# Patient Record
Sex: Male | Born: 1960 | Race: White | Hispanic: No | Smoking: Current every day smoker
Health system: Southern US, Community
[De-identification: ages and names within clinical notes are randomized; demographics above are authoritative.]

## PROBLEM LIST (undated history)

## (undated) DIAGNOSIS — C679 Malignant neoplasm of bladder, unspecified: Secondary | ICD-10-CM

## (undated) DIAGNOSIS — R319 Hematuria, unspecified: Secondary | ICD-10-CM

## (undated) DIAGNOSIS — F1121 Opioid dependence, in remission: Secondary | ICD-10-CM

## (undated) DIAGNOSIS — Z973 Presence of spectacles and contact lenses: Secondary | ICD-10-CM

## (undated) DIAGNOSIS — F1021 Alcohol dependence, in remission: Secondary | ICD-10-CM

## (undated) DIAGNOSIS — Z87898 Personal history of other specified conditions: Secondary | ICD-10-CM

## (undated) DIAGNOSIS — H9190 Unspecified hearing loss, unspecified ear: Secondary | ICD-10-CM

## (undated) DIAGNOSIS — N343 Urethral syndrome, unspecified: Secondary | ICD-10-CM

## (undated) DIAGNOSIS — F1991 Other psychoactive substance use, unspecified, in remission: Secondary | ICD-10-CM

---

## 2002-11-19 ENCOUNTER — Emergency Department (HOSPITAL_COMMUNITY): Admission: EM | Admit: 2002-11-19 | Discharge: 2002-11-19 | Payer: Self-pay | Admitting: Emergency Medicine

## 2013-09-24 ENCOUNTER — Ambulatory Visit: Payer: BC Managed Care – PPO

## 2013-09-24 ENCOUNTER — Ambulatory Visit (INDEPENDENT_AMBULATORY_CARE_PROVIDER_SITE_OTHER): Payer: BC Managed Care – PPO | Admitting: Family Medicine

## 2013-09-24 VITALS — BP 110/70 | HR 87 | Temp 98.6°F | Resp 18 | Ht 67.5 in | Wt 160.2 lb

## 2013-09-24 DIAGNOSIS — M25562 Pain in left knee: Secondary | ICD-10-CM

## 2013-09-24 DIAGNOSIS — M25569 Pain in unspecified knee: Secondary | ICD-10-CM

## 2013-09-24 DIAGNOSIS — IMO0002 Reserved for concepts with insufficient information to code with codable children: Secondary | ICD-10-CM

## 2013-09-24 DIAGNOSIS — S40212A Abrasion of left shoulder, initial encounter: Secondary | ICD-10-CM

## 2013-09-24 MED ORDER — DICLOFENAC SODIUM 75 MG PO TBEC: 75.0000 mg | DELAYED_RELEASE_TABLET | Freq: Two times a day (BID) | ORAL | Status: DC

## 2013-09-24 MED ORDER — HYDROCODONE-ACETAMINOPHEN 5-325 MG PO TABS: 1.0000 | ORAL_TABLET | Freq: Four times a day (QID) | ORAL | Status: DC | PRN

## 2013-09-24 NOTE — Addendum Note (Signed)
Addended by: Ivor Reining on: 09/24/2013 12:12 PM   Modules accepted: Level of Service
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
## 2013-09-24 NOTE — Patient Instructions (Signed)
Please return if knee pain persists or worsens over the next 3 or 4 days. The x-rays are normal and it appears that you had just got a bad bone bruise. He can use the knee immobilizer for the next 5-7 days.

## 2014-02-22 ENCOUNTER — Emergency Department (HOSPITAL_COMMUNITY)
Admission: EM | Admit: 2014-02-22 | Discharge: 2014-02-22 | Disposition: A | Discharge status: Home / Self Care | Payer: 59 | Attending: Emergency Medicine | Admitting: Emergency Medicine

## 2014-02-22 ENCOUNTER — Encounter (HOSPITAL_COMMUNITY): Payer: Self-pay

## 2014-02-22 DIAGNOSIS — T7840XA Allergy, unspecified, initial encounter: Secondary | ICD-10-CM

## 2014-02-22 DIAGNOSIS — Z791 Long term (current) use of non-steroidal anti-inflammatories (NSAID): Secondary | ICD-10-CM | POA: Diagnosis not present

## 2014-02-22 DIAGNOSIS — X58XXXA Exposure to other specified factors, initial encounter: Secondary | ICD-10-CM | POA: Insufficient documentation

## 2014-02-22 DIAGNOSIS — L500 Allergic urticaria: Secondary | ICD-10-CM | POA: Diagnosis not present

## 2014-02-22 DIAGNOSIS — Y9389 Activity, other specified: Secondary | ICD-10-CM | POA: Diagnosis not present

## 2014-02-22 DIAGNOSIS — Z720 Tobacco use: Secondary | ICD-10-CM | POA: Diagnosis not present

## 2014-02-22 DIAGNOSIS — Y998 Other external cause status: Secondary | ICD-10-CM | POA: Diagnosis not present

## 2014-02-22 DIAGNOSIS — Y9289 Other specified places as the place of occurrence of the external cause: Secondary | ICD-10-CM | POA: Insufficient documentation

## 2014-02-22 MED ORDER — TRAMADOL HCL 50 MG PO TABS: 50.0000 mg | ORAL_TABLET | Freq: Four times a day (QID) | ORAL | Status: DC | PRN

## 2014-02-22 NOTE — Discharge Instructions (Signed)
Allergies  Allergies may happen from anything your body is sensitive to. This may be food, medicines, pollens, chemicals, and many other things. Food allergies can be severe and deadly.  HOME CARE  If you do not know what causes a reaction, keep a diary. Write down the foods you ate and the symptoms that followed. Avoid foods that cause reactions.  If you have red raised spots (hives) or a rash:  Take medicine as told by your doctor.  Use medicines for red raised spots and itching as needed.  Apply cold cloths (compresses) to the skin. Take a cool bath. Avoid hot baths or showers.  If you are severely allergic:  It is often necessary to go to the hospital after you have treated your reaction.  Wear your medical alert jewelry.  You and your family must learn how to give a allergy shot or use an allergy kit (anaphylaxis kit).  Always carry your allergy kit or shot with you. Use this medicine as told by your doctor if a severe reaction is occurring. GET HELP RIGHT AWAY IF:  You have trouble breathing or are making high-pitched whistling sounds (wheezing).  You have a tight feeling in your chest or throat.  You have a puffy (swollen) mouth.  You have red raised spots, puffiness (swelling), or itching all over your body.  You have had a severe reaction that was helped by your allergy kit or shot. The reaction can return once the medicine has worn off.  You think you are having a food allergy. Symptoms most often happen within 30 minutes of eating a food.  Your symptoms have not gone away within 2 days or are getting worse.  You have new symptoms.  You want to retest yourself with a food or drink you think causes an allergic reaction. Only do this under the care of a doctor. MAKE SURE YOU:   Understand these instructions.  Will watch your condition.  Will get help right away if you are not doing well or get worse. Document Released: 06/21/2012 Document Reviewed:  06/21/2012 [facility] Patient Information 2015 [place]. This information is not intended to replace advice given to you by your health care provider. Make sure you discuss any questions you have with your health care provider.   Emergency Department Resource Guide 1) Find a Doctor and Pay Out of Pocket Although you won't have to find out who is covered by your insurance plan, it is a good idea to ask around and get recommendations. You will then need to call the office and see if the doctor you have chosen will accept you as a new patient and what types of options they offer for patients who are self-pay. Some doctors offer discounts or will set up payment plans for their patients who do not have insurance, but you will need to ask so you aren't surprised when you get to your appointment.  2) Contact Your Local Health Department Not all health departments have doctors that can see patients for sick visits, but many do, so it is worth a call to see if yours does. If you don't know where your local health department is, you can check in your phone book. The CDC also has a tool to help you locate your state's health department, and many state websites also have listings of all of their local health departments.  3) Find a [facility] If your illness is not likely to be very severe or complicated, you may want  to try a walk in clinic. These are popping up all over the country in pharmacies, drugstores, and shopping centers. They're usually staffed by nurse practitioners or physician assistants that have been trained to treat common illnesses and complaints. They're usually fairly quick and inexpensive. However, if you have serious medical issues or chronic medical problems, these are probably not your best option.  No Primary Care Doctor: - Call Health Connect at  [phone] - they can help you locate a primary care doctor that  accepts your insurance, provides certain services, etc. - Physician  Referral Service- [phone]  Chronic Pain Problems: Organization         Address  Phone   Notes  [facility]  [phone] Patients need to be referred by their primary care doctor.   Medication Assistance: Organization         Address  Phone   Notes  [facility] Medication [facility] [address] [place], [place] [address] [phone] --Must be a resident of [facility] -- Must have NO insurance coverage whatsoever (no Medicaid/ Medicare, etc.) -- The pt. MUST have a primary care doctor that directs their care regularly and follows them in the community   [facility]ist  [phone]   [employer]  [phone]    Agencies that provide inexpensive medical care: Organization         Address  Phone   Notes  [place]  [phone]   [facility]    [phone]   [facility] [place], [place] [address] [phone]   [place] [address], [place] [phone]   Planned Parenthood    [phone]   [facility]    [phone]   [place] and [place] [address], [place] Phone:  [phone], Fax:  [phone] Hours of Operation:  9 am - 6 pm, M-F.  Also accepts Medicaid/Medicare and self-pay.  [facility] for [place], [address], [place] Phone: [phone], Fax: [phone]. Hours of Operation:  8:30 am - 5:30 pm, M-F.  Also accepts Medicaid and self-pay.  [facility] [place] [address] [place] Phone: [phone]   [place], [place], [place] [phone], Ext. [phone] Mondays & Thursdays: 7-9 AM.  First 15 patients are seen on a first come, first serve basis.    [place] Providers:  Organization         Address  Phone   Notes  [facility] [address], [place] [phone] Also accepts self-pay patients.  [facility] [address], [place]  [phone]   [place], [address], [place] [phone]   [facility] [address], [place] [phone]   Lucianne Lei [address], [place]   [phone] Only accepts [place] Access [place] patients after they have their name applied to their card.   Self-Pay (no insurance) in [facility]:  Organization         Address  Phone   Notes  Sickle Cell Patients, [facility] [place] [phone]   [facility] [place] [phone]  [facility] [place]  [address], [address], [place] [phone]   [facility]/Dr. Osei-Bonsu  [address], [place] or [address], [place] [phone] Phone number for both [place] and [place] locations is the same.  [facility] [address], [place] [phone]   [facility] [address] [place] or [address] [phone]   [facility] [address] [place] [phone], phone; [phone], fax Sees patients 1st and 3rd Saturday of every month.  Must not qualify for public or private insurance (i.e. Medicaid, Medicare, [place] Health Choice, Veterans' Benefits)  Household income should be no more than 200% of the poverty level The clinic cannot treat you if you are pregnant or think you are pregnant  Sexually transmitted diseases are not treated at the clinic.    Dental Care: Organization         Address  Phone  Notes  [facility] of [facility] [place] [phone] Accepts children up to age 14 who are enrolled  in [place] or [place]; pregnant women with a Medicaid card; and children who have applied for Medicaid or [place] Health Choice, but were declined, whose parents can pay a reduced fee at time of service.  [facility] Department of [facility]  [address], [place] [phone] Accepts children up to age 99 who are enrolled in [place] or [place]; pregnant women with a Medicaid card; and children who have applied for Medicaid or [place] Health Choice, but were declined, whose parents can pay a reduced fee at time of service.  [place] Adult [facility] Access PRO[facility]  [facility] [phone] Patients are seen by appointment only. Walk-ins are not accepted. [place] will see patients 47 years of age and older. Monday - Tuesday (8am-5pm) Most Wednesdays (8:30-5pm) $30 per visit, cash only  [facility] Adult Dental Access PROGRAM  [address], [facility] [phone] Patients are seen by appointment only. Walk-ins are not accepted. [place] will see patients 18 years of age and older. One Wednesday Evening (Monthly: Volunteer Based).  $30 per visit, cash only  [place]  [phone] for adults; Children under age 56, call Graduate Pediatric Dentistry at [phone]. Children aged 14-14, please call [phone] to request a pediatric application.  Dental services are provided in all areas of dental care including fillings, crowns and bridges, complete and partial dentures, implants, gum treatment, root canals, and extractions. Preventive care is also provided. Treatment is provided to both adults and children. Patients are selected via a lottery and there is often a waiting list.   [facility] [address] [place]  [phone] [email or web address]   [facility] [address] [place], [place] [phone], Ext. [phone] Second and Fourth Thursday of each month, opens at  6:30 AM; Clinic ends at 9 AM.  Patients are seen on a first-come first-served basis, and a limited number are seen during each clinic.   [facility]  [address] [place], [place] [phone]   Eligibility Requirements You must have lived in [place], [place], or [place] counties for at least the last three months.   You cannot be eligible for state or federal sponsored  healthcare insurance, including [employer], [place], or [employer].   You generally cannot be eligible for healthcare insurance through your employer.    How to apply: Eligibility screenings are held every Tuesday and Wednesday afternoon from 1:00 pm until 4:00 pm. You do not need an appointment for the interview!  [facility] [address] [place], [place]   [place]  [place] Department  [place]  [phone]    Behavioral Health Resources in the Community: Intensive Outpatient Programs Organization         Address  Phone  Notes  [place] [address], [place], [place] [phone]   [facility]patient [address], [place], [place]   ADS: [facility] [address] [place], [place]   [place] [address]  [place], [place] or [phone]   Substance Abuse Resources Organization         Address  Phone  Notes  Alcohol and Drug Services  [phone]   [place]  [phone]   [facility]   [place]  [phone]   Residential & Outpatient Substance Abuse Program  [phone]   Psychological Services Organization         Address  Phone  Notes  [facility] [place]  [place]  [phone]   [place] [address], [place] or [phone]

## 2014-02-22 NOTE — ED Notes (Signed)
Per EMS pt had Dental procedure today at 4:30pm; Pt believes he had allergic reaction to Novocain used; Pt took Tylenol and Advil at 1800 and 1830 pt noticed hives on chest, back ,neck. Pt had Sob on arrival; pt is current smoker; pt denies pain on arrival; pt received 50 mg Benadryl, 0.3 EPI IM and 50 mg Zantac IV prior to arrival

## 2014-02-25 NOTE — ED Provider Notes (Signed)
CSN: [id number]     Arrival date & time 02/22/14  1941 History   First MD Initiated Contact with Patient 02/22/14 1942     Chief Complaint  Patient presents with  . Allergic Reaction      HPI Per EMS pt had Dental procedure today at 4:30pm; Pt believes he had allergic reaction to Novocain used; Pt took Tylenol and Advil at 1800 and 1830 pt noticed hives on chest, back ,neck. Pt had Sob on arrival; pt is current smoker; pt denies pain on arrival; pt received 50 mg Benadryl, 0.3 EPI IM and 50 mg Zantac IV prior to arrival  History reviewed. No pertinent past medical history. History reviewed. No pertinent past surgical history. Family History  Problem Relation Age of Onset  . Diabetes Mother   . Stroke Mother   . Diabetes Father   . Heart disease Father   . Heart disease Brother   . Heart disease Paternal Grandfather    History  Substance Use Topics  . Smoking status: Current Some Day Smoker  . Smokeless tobacco: Not on file  . Alcohol Use: Yes     Comment: pt state he has "been clean for 60 days"     Review of Systems  All other systems reviewed and are negative  Allergies  Advil  Home Medications   Prior to Admission medications   Medication Sig Start Date End Date Taking? Authorizing Provider  acetaminophen (TYLENOL) 500 MG tablet Take 1,000 mg by mouth every 6 (six) hours as needed for moderate pain.   Yes Historical Provider, MD  ibuprofen (ADVIL,MOTRIN) 200 MG tablet Take 400 mg by mouth every 6 (six) hours as needed for moderate pain.   Yes Historical Provider, MD  diclofenac (VOLTAREN) 75 MG EC tablet Take 1 tablet (75 mg total) by mouth 2 (two) times daily. 09/24/13   Robyn Haber, MD  HYDROcodone-acetaminophen (NORCO) 5-325 MG per tablet Take 1 tablet by mouth every 6 (six) hours as needed for moderate pain. 09/24/13   Robyn Haber, MD  traMADol (ULTRAM) 50 MG tablet Take 1 tablet (50 mg total) by mouth every 6 (six) hours as needed. 02/22/14   Dot Lanes, MD   BP 90/52 mmHg  Pulse 62  Temp(Src) 97.6 F (36.4 C) (Oral)  Resp 16  SpO2 99% Physical Exam Physical Exam  Nursing note and vitals reviewed. Constitutional: He is oriented to person, place, and time. He appears well-developed and well-nourished. No distress.  HENT:  Head: Normocephalic and atraumatic.  Eyes: Pupils are equal, round, and reactive to light.  Neck: Normal range of motion.  Cardiovascular: Normal rate and intact distal pulses.   Pulmonary/Chest: No respiratory distress.  Abdominal: Normal appearance. He exhibits no distension.  Musculoskeletal: Normal range of motion.  Neurological: He is alert and oriented to person, place, and time. No cranial nerve deficit.  Skin: Skin is warm and dry. No rash noted.  Psychiatric: He has a normal mood and affect. His behavior is normal.   ED Course  Procedures (including critical care time)  After treatment in the ED the patient feels back to baseline and wants to go home. Labs Review Labs Reviewed - No data to display    EKG Interpretation   Date/Time:  Wednesday February 22 2014 19:52:00 EST Ventricular Rate:  57 PR Interval:    QRS Duration: 89 QT Interval:  428 QTC Calculation: 417 R Axis:   80 Text Interpretation:  Atrial fibrillation Ventricular premature complex ED  PHYSICIAN  INTERPRETATION AVAILABLE IN [place] Confirmed by TEST,  Record ([id number]) on 02/24/2014 9:28:51 AM      MDM   Final diagnoses:  Allergic reaction, initial encounter        Dot Lanes, MD 02/25/14 [id number]

## 2014-10-19 ENCOUNTER — Other Ambulatory Visit: Payer: Self-pay | Admitting: Urology

## 2014-10-24 ENCOUNTER — Encounter (HOSPITAL_BASED_OUTPATIENT_CLINIC_OR_DEPARTMENT_OTHER): Payer: Self-pay | Admitting: *Deleted

## 2014-10-24 NOTE — Progress Notes (Signed)
Pt instructed npo pmn 8/22.  To [facility] 8/23 @ 0800.  Needs hgb on arrival.  Pt instructed no smoking p mn as well.

## 2014-10-30 NOTE — H&P (Signed)
History of Present Illness F/u -     1 - bladder neoplasm - Jul 2016 - gross hematuria x 6 mo. No flank pain or constitutional symptoms. 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 He is a recovering heroin and meth addict. He's been sober for about 9 months. He needs to avoid any narcotic prescriptions.      Aug 2016 interval hx  Patient returns and continued management of gross hematuria. CT hematuria protocol revealed a bladder tumor but without evidence of T3 disease, lymphadenopathy or bony metastases. He has been well without any changes to his history.       Past Medical History Problems  1. History of depression (Z86.59)  Surgical History Problems  1. History of No Surgical Problems  Current Meds 1. No Reported Medications Recorded  Allergies Medication  1. No Known Drug Allergies  Family History Problems  1. Family history of pneumonia (Z83.1) : Mother  Social History Problems  1. Current every day smoker (F17.200) 2. Daily caffeine consumption, 2-3 servings a day 3. Divorced 4. Mother deceased 62. No alcohol use  Review of Systems  Cardiovascular: chest pain.  Respiratory: shortness of breath.    Vitals Vital Signs [Data Includes: Last 1 Day]  Recorded: 10Aug2016 08:08AM  Blood Pressure: 120 / 77 Temperature: 97 F Heart Rate: 57  Physical Exam Constitutional: Well nourished and well developed . No acute distress.  Pulmonary: No respiratory distress and normal respiratory rhythm and effort.  Cardiovascular: Heart rate and rhythm are normal . No peripheral edema.  Neuro/Psych:. Mood and affect are appropriate.    Results/Data Urine [Data Includes: Last 1 Day]   [id number]  COLOR AMBER   APPEARANCE CLOUDY   SPECIFIC GRAVITY 1.025   pH 6.5   GLUCOSE NEGATIVE   BILIRUBIN NEGATIVE   KETONE NEGATIVE   BLOOD 3+   PROTEIN 2+   NITRITE NEGATIVE   LEUKOCYTE ESTERASE 1+   SQUAMOUS EPITHELIAL/HPF 0-5 HPF  WBC 6-10 WBC/HPF  RBC >60  RBC/HPF  BACTERIA FEW HPF  CRYSTALS NONE SEEN HPF  CASTS NONE SEEN LPF  Yeast NONE SEEN HPF   the following images/tracing/specimen were independently visualized: 1  CT1 .     1 Amended By: Festus Aloe; Oct 18 2014 8:48 AM EST  Procedure  Procedure:1  Cystoscopy1    Indication:1  Hematuria1 . History of Urothelial Carcinoma1 .  Informed Consent: 1  Risks, benefits, and potential adverse events were discussed and informed consent was obtained1  from the patient1 .  Prep:1  The patient was prepped with betadine1 .  Antibiotic prophylaxis:1  Ciprofloxacin1 .  Procedure Note:  Urethral meatus:1 . No abnormalities1 .  Anterior urethra:1  No abnormalities1 .  Prostatic urethra:1  No abnormalities1 .  Bladder: Visulization was1  clear1 . The ureteral orifices1  were in the normal anatomic position bilaterally1  and had clear efflux of urine1 . A  sessile1  tumor was seen in the bladder1 . This tumor was located  on the anterior aspect1  ,  at the neck1  of the bladder1 .  and extending out anteriorly for about 5 cm.1 The patient tolerated the procedure well1   Complications:1  None1 .     1 Amended By: Festus Aloe; Oct 18 2014 8:47 AM EST  Assessment Assessed  1. 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 Follow-up Schedule Surgery Office  Follow-up  Status: Hold For - Appointment   Requested for: 10Aug2016 Health Maintenance  2. UA With REFLEX; [Do Not Release]; Status:Resulted - Requires Verification;   Done:  [id number] 07:53AM  Discussion/Summary       bladder neoplasm -- patient has a nodular anterior bladder tumor that extends down into the bladder neck. I discussed with him the CT and cystoscopic findings and the nature risks benefits and alternatives to TURBT. The tumor is broad-based and nodular and he is not a good candidate for mitomycin. We discussed he may need a staged procedure with a repeat resection and need for foley, etc. All questions  answered. He elects to proceed.       Signatures Electronically signed by : Festus Aloe, M.D.; Oct 18 2014  8:48AM EST

## 2014-10-31 ENCOUNTER — Ambulatory Visit (HOSPITAL_BASED_OUTPATIENT_CLINIC_OR_DEPARTMENT_OTHER): Payer: Commercial Managed Care - HMO | Admitting: Anesthesiology

## 2014-10-31 ENCOUNTER — Ambulatory Visit (HOSPITAL_BASED_OUTPATIENT_CLINIC_OR_DEPARTMENT_OTHER)
Admission: RE | Admit: 2014-10-31 | Discharge: 2014-10-31 | Disposition: A | Discharge status: Home / Self Care | Payer: Commercial Managed Care - HMO | Source: Ambulatory Visit | Attending: Urology | Admitting: Urology

## 2014-10-31 ENCOUNTER — Encounter (HOSPITAL_BASED_OUTPATIENT_CLINIC_OR_DEPARTMENT_OTHER): Payer: Self-pay | Admitting: *Deleted

## 2014-10-31 ENCOUNTER — Encounter (HOSPITAL_BASED_OUTPATIENT_CLINIC_OR_DEPARTMENT_OTHER)
Admission: RE | Disposition: A | Discharge status: Home / Self Care | Payer: Self-pay | Source: Ambulatory Visit | Attending: Urology

## 2014-10-31 DIAGNOSIS — F17210 Nicotine dependence, cigarettes, uncomplicated: Secondary | ICD-10-CM | POA: Insufficient documentation

## 2014-10-31 DIAGNOSIS — C674 Malignant neoplasm of posterior wall of bladder: Secondary | ICD-10-CM | POA: Diagnosis not present

## 2014-10-31 DIAGNOSIS — C673 Malignant neoplasm of anterior wall of bladder: Secondary | ICD-10-CM | POA: Insufficient documentation

## 2014-10-31 DIAGNOSIS — D494 Neoplasm of unspecified behavior of bladder: Secondary | ICD-10-CM | POA: Diagnosis present

## 2014-10-31 DIAGNOSIS — F329 Major depressive disorder, single episode, unspecified: Secondary | ICD-10-CM | POA: Diagnosis not present

## 2014-10-31 DIAGNOSIS — R310 Gross hematuria: Secondary | ICD-10-CM | POA: Diagnosis not present

## 2014-10-31 HISTORY — DX: Hematuria, unspecified: R31.9

## 2014-10-31 HISTORY — PX: TRANSURETHRAL RESECTION OF BLADDER TUMOR: SHX2575

## 2014-10-31 HISTORY — DX: Presence of spectacles and contact lenses: Z97.3

## 2014-10-31 HISTORY — DX: Unspecified hearing loss, unspecified ear: H91.90

## 2014-10-31 HISTORY — DX: Urethral syndrome, unspecified: N34.3

## 2014-10-31 HISTORY — DX: Opioid dependence, in remission: F11.21

## 2014-10-31 HISTORY — PX: CYSTOSCOPY WITH URETHRAL DILATATION: SHX5125

## 2014-10-31 LAB — POCT HEMOGLOBIN-HEMACUE: HEMOGLOBIN: 16 g/dL (ref 13.0–17.0)

## 2014-10-31 SURGERY — TURBT (TRANSURETHRAL RESECTION OF BLADDER TUMOR)
Anesthesia: General | Site: Urethra

## 2014-10-31 MED ORDER — CEFAZOLIN SODIUM-DEXTROSE 2-3 GM-% IV SOLR
INTRAVENOUS | Status: AC
  Filled 2014-10-31: qty 50

## 2014-10-31 MED ORDER — LIDOCAINE HCL (CARDIAC) 20 MG/ML IV SOLN
INTRAVENOUS | Status: DC | PRN
  Administered 2014-10-31: 80 mg via INTRAVENOUS

## 2014-10-31 MED ORDER — SODIUM CHLORIDE 0.9 % IR SOLN
Status: DC | PRN
  Administered 2014-10-31: 3000 mL

## 2014-10-31 MED ORDER — FENTANYL CITRATE (PF) 100 MCG/2ML IJ SOLN
INTRAMUSCULAR | Status: AC
  Filled 2014-10-31: qty 6

## 2014-10-31 MED ORDER — OXYBUTYNIN CHLORIDE 5 MG PO TABS
5.0000 mg | ORAL_TABLET | Freq: Three times a day (TID) | ORAL | Status: DC
  Administered 2014-10-31: 5 mg via ORAL
  Filled 2014-10-31: qty 1

## 2014-10-31 MED ORDER — ONDANSETRON HCL 4 MG/2ML IJ SOLN
INTRAMUSCULAR | Status: DC | PRN
  Administered 2014-10-31: 4 mg via INTRAVENOUS

## 2014-10-31 MED ORDER — FENTANYL CITRATE (PF) 100 MCG/2ML IJ SOLN
25.0000 ug | INTRAMUSCULAR | Status: DC | PRN
  Filled 2014-10-31: qty 1

## 2014-10-31 MED ORDER — CEFAZOLIN SODIUM 1-5 GM-% IV SOLN
1.0000 g | INTRAVENOUS | Status: AC
  Administered 2014-10-31: 2 g via INTRAVENOUS
  Filled 2014-10-31: qty 50

## 2014-10-31 MED ORDER — MIDAZOLAM HCL 2 MG/2ML IJ SOLN
INTRAMUSCULAR | Status: AC
  Filled 2014-10-31: qty 4

## 2014-10-31 MED ORDER — OXYBUTYNIN CHLORIDE 5 MG PO TABS: 5.0000 mg | ORAL_TABLET | Freq: Three times a day (TID) | ORAL | Status: DC | PRN

## 2014-10-31 MED ORDER — LACTATED RINGERS IV SOLN
INTRAVENOUS | Status: DC
  Administered 2014-10-31 (×2): via INTRAVENOUS
  Filled 2014-10-31: qty 1000

## 2014-10-31 MED ORDER — PHENAZOPYRIDINE HCL 200 MG PO TABS
200.0000 mg | ORAL_TABLET | Freq: Three times a day (TID) | ORAL | Status: DC
  Administered 2014-10-31: 200 mg via ORAL
  Filled 2014-10-31: qty 1

## 2014-10-31 MED ORDER — PHENAZOPYRIDINE HCL 200 MG PO TABS: 200.0000 mg | ORAL_TABLET | Freq: Three times a day (TID) | ORAL | Status: DC | PRN

## 2014-10-31 MED ORDER — OXYBUTYNIN CHLORIDE 5 MG PO TABS
ORAL_TABLET | ORAL | Status: AC
  Filled 2014-10-31: qty 1

## 2014-10-31 MED ORDER — MIDAZOLAM HCL 5 MG/5ML IJ SOLN
INTRAMUSCULAR | Status: DC | PRN
  Administered 2014-10-31: 2 mg via INTRAVENOUS

## 2014-10-31 MED ORDER — PHENAZOPYRIDINE HCL 100 MG PO TABS
ORAL_TABLET | ORAL | Status: AC
  Filled 2014-10-31: qty 2

## 2014-10-31 MED ORDER — CEPHALEXIN 500 MG PO CAPS: 500.0000 mg | ORAL_CAPSULE | Freq: Every day | ORAL | Status: DC

## 2014-10-31 MED ORDER — LACTATED RINGERS IV SOLN
INTRAVENOUS | Status: DC
  Filled 2014-10-31: qty 1000

## 2014-10-31 MED ORDER — DEXAMETHASONE SODIUM PHOSPHATE 4 MG/ML IJ SOLN
INTRAMUSCULAR | Status: DC | PRN
  Administered 2014-10-31: 10 mg via INTRAVENOUS

## 2014-10-31 MED ORDER — FENTANYL CITRATE (PF) 100 MCG/2ML IJ SOLN
INTRAMUSCULAR | Status: DC | PRN
  Administered 2014-10-31 (×2): 50 ug via INTRAVENOUS

## 2014-10-31 MED ORDER — PROPOFOL 10 MG/ML IV BOLUS
INTRAVENOUS | Status: DC | PRN
  Administered 2014-10-31: 40 mg via INTRAVENOUS
  Administered 2014-10-31: 180 mg via INTRAVENOUS
  Administered 2014-10-31: 40 mg via INTRAVENOUS

## 2014-10-31 SURGICAL SUPPLY — 30 items
BAG DRAIN URO-CYSTO SKYTR STRL (DRAIN) ×4 IMPLANT
BAG URINE DRAINAGE (UROLOGICAL SUPPLIES) ×4 IMPLANT
BAG URINE LEG 19OZ MD ST LTX (BAG) IMPLANT
CANISTER SUCT LVC 12 LTR MEDI- (MISCELLANEOUS) IMPLANT
CATH FOLEY 2WAY SLVR  5CC 18FR (CATHETERS) ×2
CATH FOLEY 2WAY SLVR  5CC 20FR (CATHETERS)
CATH FOLEY 2WAY SLVR  5CC 22FR (CATHETERS)
CATH FOLEY 2WAY SLVR 5CC 18FR (CATHETERS) ×2 IMPLANT
CATH FOLEY 2WAY SLVR 5CC 20FR (CATHETERS) IMPLANT
CATH FOLEY 2WAY SLVR 5CC 22FR (CATHETERS) IMPLANT
CLOTH BEACON ORANGE TIMEOUT ST (SAFETY) ×4 IMPLANT
DRSG TELFA 3X8 NADH (GAUZE/BANDAGES/DRESSINGS) IMPLANT
ELECT BUTTON BIOP 24F 90D PLAS (MISCELLANEOUS) IMPLANT
ELECT REM PT RETURN 9FT ADLT (ELECTROSURGICAL)
ELECTRODE REM PT RTRN 9FT ADLT (ELECTROSURGICAL) IMPLANT
EVACUATOR MICROVAS BLADDER (UROLOGICAL SUPPLIES) IMPLANT
GLOVE BIO SURGEON STRL SZ7.5 (GLOVE) ×4 IMPLANT
GLOVE INDICATOR 7.5 STRL GRN (GLOVE) ×4 IMPLANT
GLOVE SURG SS PI 7.5 STRL IVOR (GLOVE) ×4 IMPLANT
GOWN STRL REUS W/ TWL XL LVL3 (GOWN DISPOSABLE) ×4 IMPLANT
GOWN STRL REUS W/TWL XL LVL3 (GOWN DISPOSABLE) ×4
HOLDER FOLEY CATH W/STRAP (MISCELLANEOUS) ×4 IMPLANT
IV NS IRRIG 3000ML ARTHROMATIC (IV SOLUTION) ×16 IMPLANT
LOOP CUT BIPOLAR 24F LRG (ELECTROSURGICAL) ×4 IMPLANT
MANIFOLD NEPTUNE II (INSTRUMENTS) ×4 IMPLANT
NS IRRIG 500ML POUR BTL (IV SOLUTION) ×4 IMPLANT
PACK CYSTO (CUSTOM PROCEDURE TRAY) ×4 IMPLANT
PLUG CATH AND CAP STER (CATHETERS) IMPLANT
SET ASPIRATION TUBING (TUBING) IMPLANT
WATER STERILE IRR 500ML POUR (IV SOLUTION) ×4 IMPLANT

## 2014-10-31 NOTE — Interval H&P Note (Signed)
History and Physical Interval Note:  10/31/2014 9:33 AM  Dustin Esparza  has presented today for surgery, with the diagnosis of BLADDDER NEOPLASM  The various methods of treatment have been discussed with the patient and family. After consideration of risks, benefits and other options for treatment, the patient has consented to  Procedure(s): TRANSURETHRAL RESECTION OF BLADDER TUMOR (TURBT) (N/A) as a surgical intervention .  The patient's history has been reviewed, patient examined, no change in status, stable for surgery.  I have reviewed the patient's chart and labs. He has been well with no fever. He continues to have gross hematuria and will have dysuria when he passes blood, but otherwise no bladder pain. I discussed with the patient the nature, potential benefits, risks and alternatives to TURBT, including side effects of the proposed treatment, the likelihood of the patient achieving the goals of the procedure, and any potential problems that might occur during the procedure or recuperation. Discussed with patient, friend and his dad. Discussed risk of perforation, need for foley, need for restaging, etc. All questions answered. Patient elects to proceed.     Dustin Esparza

## 2014-10-31 NOTE — Anesthesia Preprocedure Evaluation (Addendum)
Anesthesia Evaluation  Patient identified by MRN, date of birth, ID band Patient awake    Reviewed: Allergy & Precautions, H&P , NPO status , Patient's Chart, lab work & pertinent test results  Airway Mallampati: II  TM Distance: >3 FB Neck ROM: full    Dental  (+) Dental Advisory Given, Poor Dentition, Chipped, Missing Front upper tooth broken in half:   Pulmonary Current Smoker,  breath sounds clear to auscultation  Pulmonary exam normal       Cardiovascular Exercise Tolerance: Good negative cardio ROS Normal cardiovascular examRhythm:regular Rate:Normal     Neuro/Psych Seizures -, Well Controlled,  Drug related seizure and hives.  He thinks either lidocaine, tylenol, or advil ( at dentist office ) negative psych ROS   GI/Hepatic negative GI ROS, (+)     substance abuse  , History narcotic addiction   Endo/Other  negative endocrine ROS  Renal/GU negative Renal ROS  negative genitourinary   Musculoskeletal   Abdominal   Peds  Hematology negative hematology ROS (+)   Anesthesia Other Findings   Reproductive/Obstetrics negative OB ROS                          Anesthesia Physical Anesthesia Plan  ASA: II  Anesthesia Plan: General   Post-op Pain Management:    Induction: Intravenous  Airway Management Planned: LMA  Additional Equipment:   Intra-op Plan:   Post-operative Plan:   Informed Consent: I have reviewed the patients History and Physical, chart, labs and discussed the procedure including the risks, benefits and alternatives for the proposed anesthesia with the patient or authorized representative who has indicated his/her understanding and acceptance.   Dental Advisory Given  Plan Discussed with: CRNA, Surgeon and Anesthesiologist  Anesthesia Plan Comments:        Anesthesia Quick Evaluation

## 2014-10-31 NOTE — Op Note (Signed)
Preoperative diagnosis: Bladder neoplasm Postoperative diagnosis: Bladder neoplasm  Procedure: TURBT greater than 5 cm  Surgeon: Junious Silk  Anesthesia: Gen.  Indication for procedure: 54 year old white male with gross hematuria for several months. CT and cystoscopy revealed an anterior bladder tumor. He was brought for TURBT.  Findings: On exam under anesthesia the penis was circumcised and without mass or lesion. The testicles were descended bilaterally and palpably normal. On digital rectal exam the prostate was palpably normal with all landmarks preserved. On bimanual exam no bladder mass was palpated and there was no fixation of the tissues.  On cystoscopy the urethra was normal apart from some narrowing at the fossa navicularis. The prostatic urethra was normal. The trigone and ureteral orifices were in their normal orthotopic position. There was bilateral clear efflux. There were no stones or foreign bodies in the bladder. There were 3 areas of tumor in the bladder. There were 2 superficial papillary patches of tumor about a centimeter each lateral and posterior to the left UO respectively. Anteriorly there was a nodular tumor that appeared high-grade and likely muscle invasive. There was a circumferential area bullous edema and inflammation around the tumor. Some of this inflammation extended toward the bladder neck.  Description of procedure: After consent was obtained patient brought to the operating room. After adequate anesthesia the patient was placed in lithotomy position and prepped and draped in the usual sterile fashion. A timeout was performed to confirm the patient and procedure. An exam under anesthesia was performed.  Cystoscope was passed per urethra and the bladder carefully inspected with the 30 and 70 lens. The resectoscope, 25 [place], would not pass due to a tight fossa navicularis which required dilation to 30 [place]. The resectoscope was then passed without difficulty  into the bladder. I resected the the first tumor lateral to the UO and fulgurated it space. The more posterior tumor was up the wall of the bladder and appeared very superficial therefore I got samples of this with the cold cup. Then this area was fulgurated. The 2 left posterior tumors were very similar in appearance depth in morphology and resected together his left posterior tumor.  Next attention was turned anteriorly. The tumor was not even visible with the 30 scope and I doubt constant pressure with the left hand while resected with the right hand. I was able to resect the tumor down to level with the bladder wall but I believe there are areas where some infiltrative tumor remains. Surrounding the tumor was a 1-2 cm brought area of bullous edema that appeared to be simple cystic-appearing changes. Much of this was fulgurated as the base of the tumor was fulgurated having resected the tumor down as far as I could go safely today the base was fulgurated. The chips were evacuated. Hemostasis was excellent at low-pressure.  The UO's were normal with clear efflux. The bladder was refilled the scope removed. An 56 French Foley catheter was placed. The irrigation was clear. The patient was awakened taken to recovery room in stable condition.  Complications: None  Blood loss: Minimal  Specimens to pathology: #1 left posterior bladder tumor (2 small tumors sent together) #2 anterior bladder tumor  Drains: 18 French Foley catheter

## 2014-10-31 NOTE — Anesthesia Postprocedure Evaluation (Signed)
  Anesthesia Post-op Note  Patient: Dustin Esparza  Procedure(s) Performed: Procedure(s) (LRB): TRANSURETHRAL RESECTION OF BLADDER TUMOR (TURBT) (N/A) CYSTOSCOPY WITH URETHRAL DILATATION (N/A)  Patient Location: PACU  Anesthesia Type: General  Level of Consciousness: awake and alert   Airway and Oxygen Therapy: Patient Spontanous Breathing  Post-op Pain: mild  Post-op Assessment: Post-op Vital signs reviewed, Patient's Cardiovascular Status Stable, Respiratory Function Stable, Patent Airway and No signs of Nausea or vomiting  Last Vitals:  Filed Vitals:   10/31/14 1200  BP: 114/80  Pulse: 68  Temp:   Resp: 15    Post-op Vital Signs: stable   Complications: No apparent anesthesia complications

## 2014-10-31 NOTE — Discharge Instructions (Signed)
Foley Catheter Care °A Foley catheter is a soft, flexible tube that is placed into the bladder to drain urine. A Foley catheter may be inserted if: °· You leak urine or are not able to control when you urinate (urinary incontinence). °· You are not able to urinate when you need to (urinary retention). °· You had prostate surgery or surgery on the genitals. °· You have certain medical conditions, such as multiple sclerosis, dementia, or a spinal cord injury. °If you are going home with a Foley catheter in place, follow the instructions below. °TAKING CARE OF THE CATHETER °1. Wash your hands with soap and water. °2. Using mild soap and warm water on a clean washcloth: °· Clean the area on your body closest to the catheter insertion site using a circular motion, moving away from the catheter. Never wipe toward the catheter because this could sweep bacteria up into the urethra and cause infection. °· Remove all traces of soap. Pat the area dry with a clean towel. For males, reposition the foreskin. °3. Attach the catheter to your leg so there is no tension on the catheter. Use adhesive tape or a leg strap. If you are using adhesive tape, remove any sticky residue left behind by the previous tape you used. °4. Keep the drainage bag below the level of the bladder, but keep it off the floor. °5. Check throughout the day to be sure the catheter is working and urine is draining freely. Make sure the tubing does not become kinked. °6. Do not pull on the catheter or try to remove it. Pulling could damage internal tissues. °TAKING CARE OF THE DRAINAGE BAGS °You will be given two drainage bags to take home. One is a large overnight drainage bag, and the other is a smaller leg bag that fits underneath clothing. You may wear the overnight bag at any time, but you should never wear the smaller leg bag at night. Follow the instructions below for how to empty, change, and clean your drainage bags. °Emptying the Drainage Bag °You must  empty your drainage bag when it is  -½ full or at least 2-3 times a day. °1. Wash your hands with soap and water. °2. Keep the drainage bag below your hips, below the level of your bladder. This stops urine from going back into the tubing and into your bladder. °3. Hold the dirty bag over the toilet or a clean container. °4. Open the pour spout at the bottom of the bag and empty the urine into the toilet or container. Do not let the pour spout touch the toilet, container, or any other surface. Doing so can place bacteria on the bag, which can cause an infection. °5. Clean the pour spout with a gauze pad or cotton ball that has rubbing alcohol on it. °6. Close the pour spout. °7. Attach the bag to your leg with adhesive tape or a leg strap. °8. Wash your hands well. °Changing the Drainage Bag °Change your drainage bag once a month or sooner if it starts to smell bad or look dirty. Below are steps to follow when changing the drainage bag. °1. Wash your hands with soap and water. °2. Pinch off the rubber catheter so that urine does not spill out. °3. Disconnect the catheter tube from the drainage tube at the connection valve. Do not let the tubes touch any surface. °4. Clean the end of the catheter tube with an alcohol wipe. Use a different alcohol wipe to clean the   end of the drainage tube. 5. Connect the catheter tube to the drainage tube of the clean drainage bag. 6. Attach the new bag to the leg with adhesive tape or a leg strap. Avoid attaching the new bag too tightly. 7. Wash your hands well. Cleaning the Drainage Bag 1. Wash your hands with soap and water. 2. Wash the bag in warm, soapy water. 3. Rinse the bag thoroughly with warm water. 4. Fill the bag with a solution of white vinegar and water (1 cup vinegar to 1 qt warm water [.2 L vinegar to 1 L warm water]). Close the bag and soak it for 30 minutes in the solution. 5. Rinse the bag with warm water. 6. Hang the bag to dry with the pour spout open  and hanging downward. 7. Store the clean bag (once it is dry) in a clean plastic bag. 8. Wash your hands well. PREVENTING INFECTION  Wash your hands before and after handling your catheter.  Take showers daily and wash the area where the catheter enters your body. Do not take baths. Replace wet leg straps with dry ones, if this applies.  Do not use powders, sprays, or lotions on the genital area. Only use creams, lotions, or ointments as directed by your caregiver.  For females, wipe from front to back after each bowel movement.  Drink enough fluids to keep your urine clear or pale yellow unless you have a fluid restriction.  Do not let the drainage bag or tubing touch or lie on the floor.  Wear cotton underwear to absorb moisture and to keep your skin drier. SEEK MEDICAL CARE IF:   Your urine is cloudy or smells unusually bad.  Your catheter becomes clogged.  You are not draining urine into the bag or your bladder feels full.  Your catheter starts to leak. SEEK IMMEDIATE MEDICAL CARE IF:   You have pain, swelling, redness, or pus where the catheter enters the body.  You have pain in the abdomen, legs, lower back, or bladder.  You have a fever.  You see blood fill the catheter, or your urine is pink or red.  You have nausea, vomiting, or chills.  Your catheter gets pulled out. MAKE SURE YOU:   Understand these instructions.  Will watch your condition.  Will get help right away if you are not doing well or get worse. Document Released: 02/24/2005 Document Revised: 07/11/2013 Document Reviewed: 02/16/2012 [facility] Patient Information 2015 [place], [place]. This information is not intended to replace advice given to you by your health care provider. Make sure you discuss any questions you have with your health care provider.   Cystoscopy, Care After Refer to this sheet in the next few weeks. These instructions provide you with information on caring for yourself after  your procedure. Your caregiver may also give you more specific instructions. Your treatment has been planned according to current medical practices, but problems sometimes occur. Call your caregiver if you have any problems or questions after your procedure. HOME CARE INSTRUCTIONS  Things you can do to ease any discomfort after your procedure include:  Drinking enough water and fluids to keep your urine clear or pale yellow.  Taking a warm bath to relieve any burning feelings. SEEK IMMEDIATE MEDICAL CARE IF:  7. You have an increase in blood in your urine. 8. You notice blood clots in your urine. 9. You have difficulty passing urine. 10. You have the chills. 11. You have abdominal pain. 12. You have a fever  or persistent symptoms for more than 2-3 days. 13. You have a fever and your symptoms suddenly get worse. MAKE SURE YOU:  9. Understand these instructions. 10. Will watch your condition. 11. Will get help right away if you are not doing well or get worse. Document Released: 09/13/2004 Document Revised: 10/27/2012 Document Reviewed: 08/18/2011 [facility] Patient Information 2015 [place], [place]. This information is not intended to replace advice given to you by your health care provider. Make sure you discuss any questions you have with your health care provider.     Post Anesthesia Home Care Instructions  Activity: Get plenty of rest for the remainder of the day. A responsible adult should stay with you for 24 hours following the procedure.  For the next 24 hours, DO NOT: -Drive a car -Paediatric nurse -Drink alcoholic beverages -Take any medication unless instructed by your physician -Make any legal decisions or sign important papers.  Meals: Start with liquid foods such as gelatin or soup. Progress to regular foods as tolerated. Avoid greasy, spicy, heavy foods. If nausea and/or vomiting occur, drink only clear liquids until the nausea and/or vomiting subsides. Call your  physician if vomiting continues.  Special Instructions/Symptoms: Your throat may feel dry or sore from the anesthesia or the breathing tube placed in your throat during surgery. If this causes discomfort, gargle with warm salt water. The discomfort should disappear within 24 hours.  If you had a scopolamine patch placed behind your ear for the management of post- operative nausea and/or vomiting:  1. The medication in the patch is effective for 72 hours, after which it should be removed.  Wrap patch in a tissue and discard in the trash. Wash hands thoroughly with soap and water. 2. You may remove the patch earlier than 72 hours if you experience unpleasant side effects which may include dry mouth, dizziness or visual disturbances. 3. Avoid touching the patch. Wash your hands with soap and water after contact with the patch.

## 2014-10-31 NOTE — Anesthesia Procedure Notes (Signed)
Procedure Name: LMA Insertion Date/Time: 10/31/2014 9:53 AM Performed by: Wanita Chamberlain Pre-anesthesia Checklist: Patient identified, Timeout performed, Emergency Drugs available, Suction available and Patient being monitored Patient Re-evaluated:Patient Re-evaluated prior to inductionOxygen Delivery Method: Circle system utilized Preoxygenation: Pre-oxygenation with 100% oxygen Intubation Type: IV induction Ventilation: Mask ventilation without difficulty LMA: LMA inserted LMA Size: 5.0 Number of attempts: 1 Airway Equipment and Method: Bite block Placement Confirmation: breath sounds checked- equal and bilateral and positive ETCO2 Tube secured with: Tape

## 2014-10-31 NOTE — Transfer of Care (Signed)
Immediate Anesthesia Transfer of Care Note  Patient: TAESEAN RETH  Procedure(s) Performed: Procedure(s): TRANSURETHRAL RESECTION OF BLADDER TUMOR (TURBT) (N/A) CYSTOSCOPY WITH URETHRAL DILATATION (N/A)  Patient Location: PACU  Anesthesia Type:General  Level of Consciousness: awake, alert , oriented and patient cooperative  Airway & Oxygen Therapy: Patient Spontanous Breathing and Patient connected to nasal cannula oxygen  Post-op Assessment: Report given to RN and Post -op Vital signs reviewed and stable  Post vital signs: Reviewed and stable  Last Vitals:  Filed Vitals:   10/31/14 0817  BP: 102/74  Pulse: 72  Temp: 36.5 C  Resp: 20    Complications: No apparent anesthesia complications

## 2014-11-01 ENCOUNTER — Encounter (HOSPITAL_BASED_OUTPATIENT_CLINIC_OR_DEPARTMENT_OTHER): Payer: Self-pay | Admitting: Urology

## 2014-11-06 ENCOUNTER — Other Ambulatory Visit: Payer: Self-pay | Admitting: Urology

## 2014-11-08 ENCOUNTER — Other Ambulatory Visit: Payer: Self-pay | Admitting: Urology

## 2014-11-27 ENCOUNTER — Encounter (HOSPITAL_BASED_OUTPATIENT_CLINIC_OR_DEPARTMENT_OTHER): Payer: Self-pay | Admitting: *Deleted

## 2014-11-28 ENCOUNTER — Encounter (HOSPITAL_BASED_OUTPATIENT_CLINIC_OR_DEPARTMENT_OTHER): Payer: Self-pay | Admitting: *Deleted

## 2014-11-29 ENCOUNTER — Encounter (HOSPITAL_BASED_OUTPATIENT_CLINIC_OR_DEPARTMENT_OTHER): Payer: Self-pay | Admitting: *Deleted

## 2014-11-29 NOTE — Progress Notes (Signed)
NPO AFTER MN.  ARRIVE AT 0730.  NEEDS HG.

## 2014-12-04 NOTE — H&P (Signed)
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 August 2016. Presented with gross hematuria. 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 He needs to avoid any narcotic prescriptions.   Aug 2016 interval hx   Returns and management of bladder cancer. Foley catheter is been draining well. Urine clear. He's had no fever. He could not tolerate the antibiotic prescribed (nausea).      Past Medical History Problems  1. History of depression (Z86.59)  Surgical History Problems  1. History of No Surgical Problems  Current Meds 1. No Reported Medications Recorded  Allergies Medication  1. No Known Drug Allergies  Family History Problems  1. Family history of pneumonia (Z83.1) : Mother  Social History Problems  1. Current every day smoker (F17.200) 2. Daily caffeine consumption, 2-3 servings a day 3. Divorced 4. Mother deceased 21. No alcohol use  Vitals Vital Signs [Data Includes: Last 1 Day]  Recorded: 29Aug2016 09:39AM  Blood Pressure: 114 / 78 Temperature: 98.3 F Heart Rate: 68  Procedure Bladder was filled with 200 cc. Foley was removed and he voided 200 cc without difficulty. Covered with one Cipro.     Assessment Assessed  1. 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 Follow-up Schedule Surgery Office  Follow-up  Status: Hold For - Appointment   Requested for: 29Aug2016 Malignant neoplasm of lateral wall of urinary bladder  2. Fill, Pull; Status:Complete;   Done: [id number]  Discussion/Summary Low-grade TA disease left bladder,  high-grade T1 disease-discussed stage, grade, prognosis using the Understanding Bladder Cancer booklet. Discussed importance of re-resection of this area for proper staging and to ensure no residual tumor. Discussed again nature risks and benefits of TURBT including risk of bleeding, infection, perforation among others. Patient elects to proceed.      Signatures Electronically signed by : Festus Aloe, M.D.; Nov 06 2014 10:48AM EST

## 2014-12-05 ENCOUNTER — Encounter (HOSPITAL_BASED_OUTPATIENT_CLINIC_OR_DEPARTMENT_OTHER): Payer: Self-pay | Admitting: *Deleted

## 2014-12-05 ENCOUNTER — Ambulatory Visit (HOSPITAL_BASED_OUTPATIENT_CLINIC_OR_DEPARTMENT_OTHER): Payer: Commercial Managed Care - HMO | Admitting: Anesthesiology

## 2014-12-05 ENCOUNTER — Encounter (HOSPITAL_BASED_OUTPATIENT_CLINIC_OR_DEPARTMENT_OTHER)
Admission: RE | Disposition: A | Discharge status: Home / Self Care | Payer: Self-pay | Source: Ambulatory Visit | Attending: Urology

## 2014-12-05 ENCOUNTER — Ambulatory Visit (HOSPITAL_BASED_OUTPATIENT_CLINIC_OR_DEPARTMENT_OTHER)
Admission: RE | Admit: 2014-12-05 | Discharge: 2014-12-05 | Disposition: A | Discharge status: Home / Self Care | Payer: Commercial Managed Care - HMO | Source: Ambulatory Visit | Attending: Urology | Admitting: Urology

## 2014-12-05 DIAGNOSIS — C673 Malignant neoplasm of anterior wall of bladder: Secondary | ICD-10-CM | POA: Insufficient documentation

## 2014-12-05 DIAGNOSIS — N3289 Other specified disorders of bladder: Secondary | ICD-10-CM | POA: Insufficient documentation

## 2014-12-05 DIAGNOSIS — N3090 Cystitis, unspecified without hematuria: Secondary | ICD-10-CM | POA: Insufficient documentation

## 2014-12-05 DIAGNOSIS — C672 Malignant neoplasm of lateral wall of bladder: Secondary | ICD-10-CM | POA: Diagnosis not present

## 2014-12-05 DIAGNOSIS — F17200 Nicotine dependence, unspecified, uncomplicated: Secondary | ICD-10-CM | POA: Insufficient documentation

## 2014-12-05 HISTORY — DX: Personal history of other specified conditions: Z87.898

## 2014-12-05 HISTORY — DX: Other psychoactive substance use, unspecified, in remission: F19.91

## 2014-12-05 HISTORY — DX: Malignant neoplasm of bladder, unspecified: C67.9

## 2014-12-05 HISTORY — DX: Alcohol dependence, in remission: F10.21

## 2014-12-05 HISTORY — PX: TRANSURETHRAL RESECTION OF BLADDER TUMOR: SHX2575

## 2014-12-05 LAB — POCT HEMOGLOBIN-HEMACUE: HEMOGLOBIN: 16.2 g/dL (ref 13.0–17.0)

## 2014-12-05 SURGERY — TURBT (TRANSURETHRAL RESECTION OF BLADDER TUMOR)
Anesthesia: General | Site: Urethra

## 2014-12-05 MED ORDER — PROPOFOL 10 MG/ML IV BOLUS
INTRAVENOUS | Status: DC | PRN
  Administered 2014-12-05: 200 mg via INTRAVENOUS

## 2014-12-05 MED ORDER — MIDAZOLAM HCL 5 MG/5ML IJ SOLN
INTRAMUSCULAR | Status: DC | PRN
  Administered 2014-12-05 (×2): 1 mg via INTRAVENOUS

## 2014-12-05 MED ORDER — FENTANYL CITRATE (PF) 100 MCG/2ML IJ SOLN
INTRAMUSCULAR | Status: DC | PRN
  Administered 2014-12-05 (×2): 12.5 ug via INTRAVENOUS
  Administered 2014-12-05 (×2): 25 ug via INTRAVENOUS
  Administered 2014-12-05 (×5): 12.5 ug via INTRAVENOUS
  Administered 2014-12-05: 25 ug via INTRAVENOUS
  Administered 2014-12-05 (×2): 12.5 ug via INTRAVENOUS
  Administered 2014-12-05: 25 ug via INTRAVENOUS
  Administered 2014-12-05 (×3): 12.5 ug via INTRAVENOUS

## 2014-12-05 MED ORDER — PROMETHAZINE HCL 25 MG/ML IJ SOLN
6.2500 mg | INTRAMUSCULAR | Status: DC | PRN
  Filled 2014-12-05: qty 1

## 2014-12-05 MED ORDER — LACTATED RINGERS IV SOLN
INTRAVENOUS | Status: DC
  Administered 2014-12-05 (×3): via INTRAVENOUS
  Filled 2014-12-05: qty 1000

## 2014-12-05 MED ORDER — FENTANYL CITRATE (PF) 100 MCG/2ML IJ SOLN
INTRAMUSCULAR | Status: AC
  Filled 2014-12-05: qty 6

## 2014-12-05 MED ORDER — DEXAMETHASONE SODIUM PHOSPHATE 10 MG/ML IJ SOLN
INTRAMUSCULAR | Status: DC | PRN
  Administered 2014-12-05: 4 mg via INTRAVENOUS

## 2014-12-05 MED ORDER — CEFAZOLIN SODIUM 1-5 GM-% IV SOLN
1.0000 g | INTRAVENOUS | Status: DC
  Filled 2014-12-05: qty 50

## 2014-12-05 MED ORDER — ONDANSETRON HCL 4 MG/2ML IJ SOLN
INTRAMUSCULAR | Status: DC | PRN
  Administered 2014-12-05: 4 mg via INTRAVENOUS

## 2014-12-05 MED ORDER — ACETAMINOPHEN 10 MG/ML IV SOLN
INTRAVENOUS | Status: DC | PRN
  Administered 2014-12-05: 1000 mg via INTRAVENOUS

## 2014-12-05 MED ORDER — MIDAZOLAM HCL 2 MG/2ML IJ SOLN
INTRAMUSCULAR | Status: AC
  Filled 2014-12-05: qty 2

## 2014-12-05 MED ORDER — CEFAZOLIN SODIUM-DEXTROSE 2-3 GM-% IV SOLR
INTRAVENOUS | Status: AC
  Filled 2014-12-05: qty 50

## 2014-12-05 MED ORDER — CEFAZOLIN SODIUM-DEXTROSE 2-3 GM-% IV SOLR
2.0000 g | INTRAVENOUS | Status: AC
  Administered 2014-12-05: 2 g via INTRAVENOUS
  Filled 2014-12-05: qty 50

## 2014-12-05 MED ORDER — SODIUM CHLORIDE 0.9 % IR SOLN
Status: DC | PRN
  Administered 2014-12-05: 12000 mL via INTRAVESICAL

## 2014-12-05 MED ORDER — NITROFURANTOIN MONOHYD MACRO 100 MG PO CAPS: 100.0000 mg | ORAL_CAPSULE | Freq: Every day | ORAL | Status: DC

## 2014-12-05 MED ORDER — FENTANYL CITRATE (PF) 100 MCG/2ML IJ SOLN
25.0000 ug | INTRAMUSCULAR | Status: DC | PRN
  Filled 2014-12-05: qty 1

## 2014-12-05 MED ORDER — LIDOCAINE HCL (CARDIAC) 20 MG/ML IV SOLN
INTRAVENOUS | Status: DC | PRN
  Administered 2014-12-05: 80 mg via INTRAVENOUS

## 2014-12-05 SURGICAL SUPPLY — 32 items
BAG DRAIN URO-CYSTO SKYTR STRL (DRAIN) ×4 IMPLANT
BAG URINE DRAINAGE (UROLOGICAL SUPPLIES) IMPLANT
BAG URINE LEG 19OZ MD ST LTX (BAG) IMPLANT
BAG URINE LEG 500ML (DRAIN) ×4 IMPLANT
CANISTER SUCT LVC 12 LTR MEDI- (MISCELLANEOUS) IMPLANT
CATH COUDE FOLEY 2W 5CC 18FR (CATHETERS) ×4 IMPLANT
CATH FOLEY 2WAY SLVR  5CC 20FR (CATHETERS)
CATH FOLEY 2WAY SLVR  5CC 22FR (CATHETERS)
CATH FOLEY 2WAY SLVR 5CC 20FR (CATHETERS) IMPLANT
CATH FOLEY 2WAY SLVR 5CC 22FR (CATHETERS) IMPLANT
CLOTH BEACON ORANGE TIMEOUT ST (SAFETY) ×4 IMPLANT
DRSG TELFA 3X8 NADH (GAUZE/BANDAGES/DRESSINGS) IMPLANT
ELECT BUTTON BIOP 24F 90D PLAS (MISCELLANEOUS) IMPLANT
ELECT REM PT RETURN 9FT ADLT (ELECTROSURGICAL) ×4
ELECTRODE REM PT RTRN 9FT ADLT (ELECTROSURGICAL) ×2 IMPLANT
EVACUATOR MICROVAS BLADDER (UROLOGICAL SUPPLIES) IMPLANT
GLOVE BIO SURGEON STRL SZ 6.5 (GLOVE) ×6 IMPLANT
GLOVE BIO SURGEON STRL SZ7.5 (GLOVE) ×4 IMPLANT
GLOVE BIO SURGEONS STRL SZ 6.5 (GLOVE) ×2
GLOVE BIOGEL PI IND STRL 7.5 (GLOVE) ×2 IMPLANT
GLOVE BIOGEL PI INDICATOR 7.5 (GLOVE) ×2
GOWN STRL REUS W/ TWL XL LVL3 (GOWN DISPOSABLE) ×4 IMPLANT
GOWN STRL REUS W/TWL XL LVL3 (GOWN DISPOSABLE) ×4
HOLDER FOLEY CATH W/STRAP (MISCELLANEOUS) IMPLANT
IV NS IRRIG 3000ML ARTHROMATIC (IV SOLUTION) ×16 IMPLANT
LOOP CUT BIPOLAR 24F LRG (ELECTROSURGICAL) ×4 IMPLANT
MANIFOLD NEPTUNE II (INSTRUMENTS) ×4 IMPLANT
NS IRRIG 500ML POUR BTL (IV SOLUTION) ×4 IMPLANT
PACK CYSTO (CUSTOM PROCEDURE TRAY) ×4 IMPLANT
PLUG CATH AND CAP STER (CATHETERS) IMPLANT
SET ASPIRATION TUBING (TUBING) ×4 IMPLANT
WATER STERILE IRR 500ML POUR (IV SOLUTION) ×4 IMPLANT

## 2014-12-05 NOTE — Op Note (Signed)
Preoperative diagnosis: Bladder cancer Postoperative diagnosis: Same  Procedure: TURBT greater than 5 cm  Surgeon: Junious Silk  Anesthesia: Gen.  Indication for procedure: 54 year old with multifocal bladder cancer with a high-grade at least T1 lesion at the dome that requires repeat resection and staging.  Findings: On exam under anesthesia the bladder was palpably normal without mass.   The fossa navicularis was tight and required dilation to 26 [place].  On cystoscopy the left posterior bladder tumor resection sites were healing well. The tumor site at the dome was healing well and contracting with an ulcerative appearance remaining in the middle possible residual tumor. This was resected down to clear tissue with visible muscle. Clinically I felt all the tumor and abnormality was resected. I specifically went back after I thought I had resected all the tumor and resected again at the right posterior left and bladder neck margin and the tissue under the inflammation appeared normal.  Description of procedure: After consent was obtained patient brought to the operating room. After adequate anesthesia he is placed in lithotomy position and prepped and draped in the usual sterile fashion. The bladder was inspected. The tumor site at the dome was completely resected again. It's a difficult place to reach. He required slowing of his respiratory rate, continuous pressure on the bladder to lower the area into the field of view. Overall is pleased with the resection. Hemostasis insured at low-pressure after fulguration of the entire resection area. The chips were drained. The bladder was filled. The scope removed. An 77 French catheter was placed without difficulty. I was not suspicious of perforation. His abdomen was soft and nontender at the end of the procedure. Catheter was left gravity drainage with clear urine. Patient was awakened taken to recovery room in stable condition.  Complications:  None  Blood loss: Minimal  Specimens: Bladder tumor to pathology  Drains: 18 French catheter

## 2014-12-05 NOTE — Anesthesia Preprocedure Evaluation (Addendum)
Anesthesia Evaluation  Patient identified by MRN, date of birth, ID band Patient awake    Reviewed: Allergy & Precautions, NPO status , Patient's Chart, lab work & pertinent test results  Airway Mallampati: II  TM Distance: >3 FB Neck ROM: Full    Dental  (+) Poor Dentition, Dental Advisory Given   Pulmonary Current Smoker,    Pulmonary exam normal breath sounds clear to auscultation       Cardiovascular Exercise Tolerance: Good negative cardio ROS Normal cardiovascular exam Rhythm:Regular Rate:Normal     Neuro/Psych Seizures -, Well Controlled,  PSYCHIATRIC DISORDERS H/O drug related seizures.    GI/Hepatic negative GI ROS, (+)     substance abuse  alcohol use, Recovering alcoholic. H/O drug use. Recovering meth and heroin addict.   Endo/Other  negative endocrine ROS  Renal/GU negative Renal ROS  negative genitourinary   Musculoskeletal negative musculoskeletal ROS (+)   Abdominal   Peds negative pediatric ROS (+)  Hematology negative hematology ROS (+)   Anesthesia Other Findings   Reproductive/Obstetrics negative OB ROS                           Anesthesia Physical Anesthesia Plan  ASA: II  Anesthesia Plan: General   Post-op Pain Management:    Induction: Intravenous  Airway Management Planned: LMA  Additional Equipment:   Intra-op Plan:   Post-operative Plan: Extubation in OR  Informed Consent: I have reviewed the patients History and Physical, chart, labs and discussed the procedure including the risks, benefits and alternatives for the proposed anesthesia with the patient or authorized representative who has indicated his/her understanding and acceptance.   Dental advisory given  Plan Discussed with: CRNA  Anesthesia Plan Comments:         Anesthesia Quick Evaluation

## 2014-12-05 NOTE — Transfer of Care (Signed)
Immediate Anesthesia Transfer of Care Note  Patient: Dustin Esparza  Procedure(s) Performed: Procedure(s) (LRB): TRANSURETHRAL RESECTION OF BLADDER TUMOR (TURBT) (N/A) URETHRA DILATATION (N/A)  Patient Location: PACU  Anesthesia Type: General  Level of Consciousness: awake, sedated, patient cooperative and responds to stimulation  Airway & Oxygen Therapy: Patient Spontanous Breathing and Patient connected to face mask oxygen  Post-op Assessment: Report given to PACU RN, Post -op Vital signs reviewed and stable and Patient moving all extremities  Post vital signs: Reviewed and stable  Complications: No apparent anesthesia complications

## 2014-12-05 NOTE — Interval H&P Note (Signed)
History and Physical Interval Note:  12/05/2014 8:38 AM  Dustin Esparza  has presented today for surgery, with the diagnosis of BLADDER CANCER  The various methods of treatment have been discussed with the patient and family. After consideration of risks, benefits and other options for treatment, the patient has consented to  Procedure(s): TRANSURETHRAL RESECTION OF BLADDER TUMOR (TURBT) (N/A) as a surgical intervention .  The patient's history has been reviewed, patient examined, no change in status, stable for surgery.  I have reviewed the patient's chart and labs. He has had no hematuria or dysuria. Some nocturia.  Questions were answered to the patient's satisfaction.  He understands goal of staging and complete resection with possible need for further treatments.    ESKRIDGE, MATTHEW

## 2014-12-05 NOTE — Anesthesia Postprocedure Evaluation (Signed)
  Anesthesia Post-op Note  Patient: Dustin Esparza  Procedure(s) Performed: Procedure(s) (LRB): TRANSURETHRAL RESECTION OF BLADDER TUMOR (TURBT) (N/A) URETHRA DILATATION (N/A)  Patient Location: PACU  Anesthesia Type: General  Level of Consciousness: awake and alert   Airway and Oxygen Therapy: Patient Spontanous Breathing  Post-op Pain: mild  Post-op Assessment: Post-op Vital signs reviewed, Patient's Cardiovascular Status Stable, Respiratory Function Stable, Patent Airway and No signs of Nausea or vomiting  Last Vitals:  Filed Vitals:   12/05/14 1045  BP: 118/77  Pulse: 65  Temp:   Resp: 14    Post-op Vital Signs: stable   Complications: No apparent anesthesia complications

## 2014-12-05 NOTE — Discharge Instructions (Signed)
Foley Catheter Care A Foley catheter is a soft, flexible tube that is placed into the bladder to drain urine. A Foley catheter may be inserted if:  You leak urine or are not able to control when you urinate (urinary incontinence).  You are not able to urinate when you need to (urinary retention).  You had prostate surgery or surgery on the genitals.  You have certain medical conditions, such as multiple sclerosis, dementia, or a spinal cord injury. If you are going home with a Foley catheter in place, follow the instructions below. TAKING CARE OF THE CATHETER  Wash your hands with soap and water.  Using mild soap and warm water on a clean washcloth:  Clean the area on your body closest to the catheter insertion site using a circular motion, moving away from the catheter. Never wipe toward the catheter because this could sweep bacteria up into the urethra and cause infection.  Remove all traces of soap. Pat the area dry with a clean towel. For males, reposition the foreskin.  Attach the catheter to your leg so there is no tension on the catheter. Use adhesive tape or a leg strap. If you are using adhesive tape, remove any sticky residue left behind by the previous tape you used.  Keep the drainage bag below the level of the bladder, but keep it off the floor.  Check throughout the day to be sure the catheter is working and urine is draining freely. Make sure the tubing does not become kinked.  Do not pull on the catheter or try to remove it. Pulling could damage internal tissues. TAKING CARE OF THE DRAINAGE BAGS You will be given two drainage bags to take home. One is a large overnight drainage bag, and the other is a smaller leg bag that fits underneath clothing. You may wear the overnight bag at any time, but you should never wear the smaller leg bag at night. Follow the instructions below for how to empty, change, and clean your drainage bags. Emptying the Drainage Bag You must empty  your drainage bag when it is  - full or at least 2-3 times a day.  Wash your hands with soap and water.  Keep the drainage bag below your hips, below the level of your bladder. This stops urine from going back into the tubing and into your bladder.  Hold the dirty bag over the toilet or a clean container.  Open the pour spout at the bottom of the bag and empty the urine into the toilet or container. Do not let the pour spout touch the toilet, container, or any other surface. Doing so can place bacteria on the bag, which can cause an infection.  Clean the pour spout with a gauze pad or cotton ball that has rubbing alcohol on it.  Close the pour spout.  Attach the bag to your leg with adhesive tape or a leg strap.  Wash your hands well. Changing the Drainage Bag Change your drainage bag once a month or sooner if it starts to smell bad or look dirty. Below are steps to follow when changing the drainage bag.  Wash your hands with soap and water.  Pinch off the rubber catheter so that urine does not spill out.  Disconnect the catheter tube from the drainage tube at the connection valve. Do not let the tubes touch any surface.  Clean the end of the catheter tube with an alcohol wipe. Use a different alcohol wipe to clean the  end of the drainage tube.  Connect the catheter tube to the drainage tube of the clean drainage bag.  Attach the new bag to the leg with adhesive tape or a leg strap. Avoid attaching the new bag too tightly.  Wash your hands well. Cleaning the Drainage Bag  Wash your hands with soap and water.  Wash the bag in warm, soapy water.  Rinse the bag thoroughly with warm water.  Fill the bag with a solution of white vinegar and water (1 cup vinegar to 1 qt warm water [.2 L vinegar to 1 L warm water]). Close the bag and soak it for 30 minutes in the solution.  Rinse the bag with warm water.  Hang the bag to dry with the pour spout open and hanging  downward.  Store the clean bag (once it is dry) in a clean plastic bag.  Wash your hands well. PREVENTING INFECTION  Wash your hands before and after handling your catheter.  Take showers daily and wash the area where the catheter enters your body. Do not take baths. Replace wet leg straps with dry ones, if this applies.  Do not use powders, sprays, or lotions on the genital area. Only use creams, lotions, or ointments as directed by your caregiver.  For females, wipe from front to back after each bowel movement.  Drink enough fluids to keep your urine clear or pale yellow unless you have a fluid restriction.  Do not let the drainage bag or tubing touch or lie on the floor.  Wear cotton underwear to absorb moisture and to keep your skin drier. SEEK MEDICAL CARE IF:   Your urine is cloudy or smells unusually bad.  Your catheter becomes clogged.  You are not draining urine into the bag or your bladder feels full.  Your catheter starts to leak. SEEK IMMEDIATE MEDICAL CARE IF:   You have pain, swelling, redness, or pus where the catheter enters the body.  You have pain in the abdomen, legs, lower back, or bladder.  You have a fever.  You see blood fill the catheter, or your urine is pink or red.  You have nausea, vomiting, or chills.  Your catheter gets pulled out. MAKE SURE YOU:   Understand these instructions.  Will watch your condition.  Will get help right away if you are not doing well or get worse. Document Released: 02/24/2005 Document Revised: 07/11/2013 Document Reviewed: 02/16/2012 [facility] Patient Information 2015 [place], [place]. This information is not intended to replace advice given to you by your health care provider. Make sure you discuss any questions you have with your health care provider.  Transurethral Resection, Bladder Tumor A cancerous growth (tumor) can develop on the inside wall of the bladder. The bladder is the organ that holds urine. One  way to remove the tumor is a procedure called a transurethral resection. The tumor is removed (resected) through the tube that carries urine from the bladder out of the body (urethra). No cuts (incisions) are made in the skin. Instead, the procedure is done through a thin telescope, called a resectoscope. Attached to it is a light and usually a tiny camera. The resectoscope is put into the urethra. In men, the urethra opens at the end of the penis. In women, it opens just above the vagina.  A transurethral resection is usually used to remove tumors that have not gotten too big or too deep. These are called Stage 0, Stage 1 or Stage 2 bladder cancers. LET YOUR  CAREGIVER KNOW ABOUT:  On the day of the procedure, your caregivers will need to know the last time you had anything to eat or drink. This includes water, gum, and candy. In advance, make sure they know about:   Any allergies.  All medications you are taking, including:  Herbs, eyedrops, over-the-counter medications and creams.  Blood thinners (anticoagulants), aspirin or other drugs that could affect blood clotting.  Use of steroids (by mouth or as creams).  Previous problems with anesthetics, including local anesthetics.  Possibility of pregnancy, if this applies.  Any history of blood clots.  Any history of bleeding or other blood problems.  Previous surgery.  Smoking history.  Any recent symptoms of colds or infections.  Other health problems. RISKS AND COMPLICATIONS This is usually a safe procedure. Every procedure has risks, though. For a transurethral resection, they include:  Infection. Antibiotic medication would need to be taken.  Bleeding.  Light bleeding may last for several days after the procedure.  If bleeding continues or is heavy, the bladder may need rinsing. Or, a new catheter might be put in for awhile.  Sometimes bed rest is needed.  Urination problems.  Pain and burning can occur when urinating.  This usually goes away in a few days.  Scarring from the procedure can block the flow of urine.  Bladder damage.  It can be punctured or torn during removal of the tumor. If this happens, a catheter might be needed for longer. Antibiotics would be taken while the bladder heals.  Urine can leak through the hole or tear into the abdomen. If this happens, surgery may be needed to repair the bladder. BEFORE THE PROCEDURE   A medical evaluation will be done. This may include:  A physical examination.  Urine test. This is to make sure you do not have a urinary tract infection.  Blood tests.  A test that checks the heart's rhythm (electrocardiogram).  Talking with an anesthesiologist. This is the person who will be in charge of the medication (anesthesia) to keep you from feeling pain during the transurethral resection. You might be asleep during the procedure (general anesthesia) or numb from the waist down, but awake during the procedure (spinal anesthesia). Ask your surgeon what to expect.  The person who is having a transurethral resection needs to give what is called informed consent. This requires signing a legal paper that gives permission for the procedure. To give informed consent:  You must understand how the procedure is done and why.  You must be told all the risks and benefits of the procedure.  You must sign the consent. Sometimes a legal guardian can do this.  Signing should be witnessed by a healthcare professional.  The day before the surgery, eat only a light dinner. Then, do not eat or drink anything for at least 8 hours before the surgery. Ask your caregiver if it is OK to take any needed medicines with a sip of water.  Arrive at least an hour before the surgery or whenever your surgeon recommends. This will give you time to check in and fill out any needed paperwork. PROCEDURE  The preparation:  You will change into a hospital gown.  A needle will be inserted  in your arm. This is an intravenous access tube (IV). Medication will be able to flow directly into your body through this needle.  Small monitors will be put on your body. They are used to check your heart, blood pressure, and oxygen level.  You might be given medication that will help you relax (sedative).  You will be given a general anesthetic or spinal anesthesia.  The procedure:  Once you are asleep or numb from the waist down, your legs will be placed in stirrups.  The resectoscope will be passed through the urethra into the bladder.  Fluid will be passed through the resectoscope. This will fill the bladder with water.  The surgeon will examine the bladder through the scope. If the scope has a camera, it can take pictures from inside the bladder. They can be projected onto a TV screen.  The surgeon will use various tools to remove the tumor in small pieces. Sometimes a laser (a beam of light energy) is used. Other tools may use electric current.  A tube (catheter) will often be placed so that urine can drain into a bag outside the body. This process helps stop bleeding. This tube keeps blood clots from blocking the urethra.  The procedure usually takes 30 to 45 minutes. AFTER THE PROCEDURE   You will stay in a recovery area until the anesthesia has worn off. Your blood pressure and pulse will be checked every so often. Then you will be taken to a hospital room.  You may continue to get fluids through the IV for awhile.  Some pain is normal. The catheter might be uncomfortable. Pain is usually not severe. If it is, ask for pain medicine.  Your urine may look bloody after a transurethral resection. This is normal.  If bleeding is heavy, a hospital caregiver may rinse out the bladder (irrigation) through the catheter.  Once the urine is clear, the catheter will be taken out.  You will need to stay in the hospital until you can urinate on your own.  Most people stay in the  hospital for up to 4 days. PROGNOSIS   Transurethral resection is considered the best way to treat bladder tumors that are not too far along. For most people, the treatment is successful. Sometimes, though, more treatment is needed.  Bladder cancers can come back even after a successful procedure. Because of this, be sure to have a checkup with your caregiver every 3 to 6 months. If everything is OK for 3 years, you can reduce the checkups to once a year. Document Released: 12/21/2008 Document Revised: 05/19/2011 Document Reviewed: 04/05/2013 [facility] Patient Information 2015 [place], [place]. This information is not intended to replace advice given to you by your health care provider. Make sure you discuss any questions you have with your health care provider.   Post Anesthesia Home Care Instructions  Activity: Get plenty of rest for the remainder of the day. A responsible adult should stay with you for 24 hours following the procedure.  For the next 24 hours, DO NOT: -Drive a car -Paediatric nurse -Drink alcoholic beverages -Take any medication unless instructed by your physician -Make any legal decisions or sign important papers.  Meals: Start with liquid foods such as gelatin or soup. Progress to regular foods as tolerated. Avoid greasy, spicy, heavy foods. If nausea and/or vomiting occur, drink only clear liquids until the nausea and/or vomiting subsides. Call your physician if vomiting continues.  Special Instructions/Symptoms: Your throat may feel dry or sore from the anesthesia or the breathing tube placed in your throat during surgery. If this causes discomfort, gargle with warm salt water. The discomfort should disappear within 24 hours.  If you had a scopolamine patch placed behind your ear for the management of post-  operative nausea and/or vomiting:  1. The medication in the patch is effective for 72 hours, after which it should be removed.  Wrap patch in a tissue and  discard in the trash. Wash hands thoroughly with soap and water. 2. You may remove the patch earlier than 72 hours if you experience unpleasant side effects which may include dry mouth, dizziness or visual disturbances. 3. Avoid touching the patch. Wash your hands with soap and water after contact with the patch.

## 2014-12-05 NOTE — Anesthesia Procedure Notes (Signed)
Procedure Name: Intubation Date/Time: 12/05/2014 8:58 AM Performed by: Justice Rocher Pre-anesthesia Checklist: Patient identified, Emergency Drugs available, Suction available and Patient being monitored Patient Re-evaluated:Patient Re-evaluated prior to inductionOxygen Delivery Method: Circle System Utilized Preoxygenation: Pre-oxygenation with 100% oxygen Intubation Type: IV induction Ventilation: Mask ventilation without difficulty LMA Size: 4.0 Placement Confirmation: positive ETCO2 and breath sounds checked- equal and bilateral Tube secured with: Tape Dental Injury: Teeth and Oropharynx as per pre-operative assessment  Comments: Pt teeth poor. Several missing  Discolored upper molar # 2 and # 3 tooth pre op. Upper front incisor cap missing preop - discussed with pt.

## 2014-12-06 ENCOUNTER — Encounter (HOSPITAL_BASED_OUTPATIENT_CLINIC_OR_DEPARTMENT_OTHER): Payer: Self-pay | Admitting: Urology

## 2015-02-16 ENCOUNTER — Ambulatory Visit (INDEPENDENT_AMBULATORY_CARE_PROVIDER_SITE_OTHER): Payer: 59 | Admitting: Physician Assistant

## 2015-02-16 VITALS — BP 102/64 | HR 73 | Temp 98.3°F | Resp 14 | Ht 70.0 in | Wt 188.8 lb

## 2015-02-16 DIAGNOSIS — Z23 Encounter for immunization: Secondary | ICD-10-CM

## 2015-02-16 DIAGNOSIS — R6889 Other general symptoms and signs: Secondary | ICD-10-CM

## 2015-02-16 DIAGNOSIS — R059 Cough, unspecified: Secondary | ICD-10-CM

## 2015-02-16 DIAGNOSIS — R05 Cough: Secondary | ICD-10-CM | POA: Diagnosis not present

## 2015-02-16 NOTE — Progress Notes (Signed)
   Subjective:    Patient ID: Dustin Esparza, male    DOB: 25-Nov-1960, 54 y.o.   MRN: [id number]  Chief Complaint  Patient presents with  . Sore Throat    x 2 days  . Cough    Productive x 2 days  . Fever    Unspecified  . Chills  . Immunizations    Flu Vaccine   Medications, allergies, past medical history, surgical history, family history, social history and problem list reviewed and updated.  HPI  54 yom presents with above complaints. Symptoms started fairly suddenly 2 days ago (approximately 60 hours pta). Mild sore throat, prod cough with yellow sputum, chills. Generalized body aches. Persistent since onset. Denies fevers. Denies known sick contacts. Feeling slightly better today. Denies sob or cp. Denies n/v, diarrhea, abd pain.   Smokes 1 ppd. Did not take antipyretics prior to presentation today.   Interested in flu vaccine today.   Review of Systems See HPI.     Objective:   Physical Exam  Constitutional: He appears well-developed and well-nourished.  Non-toxic appearance. He does not have a sickly appearance. He does not appear ill. No distress.  BP 102/64 mmHg  Pulse 73  Temp(Src) 98.3 F (36.8 C) (Oral)  Resp 14  Ht 5\' 10"  (1.778 m)  Wt 188 lb 12.8 oz (85.639 kg)  BMI 27.09 kg/m2  SpO2 96%   HENT:  Right Ear: Tympanic membrane normal.  Left Ear: Tympanic membrane normal.  Mouth/Throat: Uvula is midline, oropharynx is clear and moist and mucous membranes are normal.  Pulmonary/Chest: Effort normal and breath sounds normal. No tachypnea.  Lymphadenopathy:       Head (right side): No submental, no submandibular and no tonsillar adenopathy present.       Head (left side): No submental, no submandibular and no tonsillar adenopathy present.    He has no cervical adenopathy.      Assessment & Plan:   Flu-like symptoms Cough --doubt bacterial etiology with normal vitals, benign exam --rest and fluids --pt declines cough medicine --rtc if not  improving early next week or with persistent fevers  Flu vaccine need - Plan: Flu Vaccine QUAD 36+ mos IM --given today as afebrile  Julieta Gutting, PA-C Physician Assistant-Certified [facility]  02/16/2015 11:15 AM

## 2015-02-16 NOTE — Patient Instructions (Signed)
You received the flu vaccine today. I don't think you have a bacterial infection causing your symptoms today.  Rest and fluids should help as things off in the next few days. If you're not improved by early next week or start to feel worse with fevers please let us know.

## 2016-03-21 ENCOUNTER — Emergency Department (HOSPITAL_COMMUNITY)
Admission: EM | Admit: 2016-03-21 | Discharge: 2016-03-22 | Disposition: A | Discharge status: Other Acute Inpatient Hospital | Payer: Commercial Managed Care - HMO | Attending: Emergency Medicine | Admitting: Emergency Medicine

## 2016-03-21 ENCOUNTER — Encounter (HOSPITAL_COMMUNITY): Payer: Self-pay | Admitting: Family Medicine

## 2016-03-21 DIAGNOSIS — R45851 Suicidal ideations: Secondary | ICD-10-CM | POA: Insufficient documentation

## 2016-03-21 DIAGNOSIS — Z8551 Personal history of malignant neoplasm of bladder: Secondary | ICD-10-CM | POA: Insufficient documentation

## 2016-03-21 DIAGNOSIS — Z5181 Encounter for therapeutic drug level monitoring: Secondary | ICD-10-CM | POA: Insufficient documentation

## 2016-03-21 DIAGNOSIS — F17210 Nicotine dependence, cigarettes, uncomplicated: Secondary | ICD-10-CM | POA: Insufficient documentation

## 2016-03-21 DIAGNOSIS — F322 Major depressive disorder, single episode, severe without psychotic features: Secondary | ICD-10-CM | POA: Diagnosis present

## 2016-03-21 LAB — COMPREHENSIVE METABOLIC PANEL
ALBUMIN: 3.8 g/dL (ref 3.5–5.0)
ALK PHOS: 61 U/L (ref 38–126)
ALT: 20 U/L (ref 17–63)
ANION GAP: 9 (ref 5–15)
AST: 20 U/L (ref 15–41)
BUN: 9 mg/dL (ref 6–20)
CALCIUM: 8.8 mg/dL — AB (ref 8.9–10.3)
CO2: 24 mmol/L (ref 22–32)
Chloride: 105 mmol/L (ref 101–111)
Creatinine, Ser: 0.78 mg/dL (ref 0.61–1.24)
GFR calc Af Amer: >60 mL/min (ref >60)
GFR calc non Af Amer: >60 mL/min (ref >60)
Glucose, Bld: 105 mg/dL — ABNORMAL HIGH (ref 65–99)
Potassium: 3.6 mmol/L (ref 3.5–5.1)
Sodium: 138 mmol/L (ref 135–145)
Total Bilirubin: 0.7 mg/dL (ref 0.3–1.2)
Total Protein: 6.7 g/dL (ref 6.5–8.1)

## 2016-03-21 LAB — CBC
HEMATOCRIT: 41.4 % (ref 39.0–52.0)
HEMOGLOBIN: 14.4 g/dL (ref 13.0–17.0)
MCH: 31 pg (ref 26.0–34.0)
MCHC: 34.8 g/dL (ref 30.0–36.0)
MCV: 89 fL (ref 78.0–100.0)
Platelets: 309 10*3/uL (ref 150–400)
RBC: 4.65 MIL/uL (ref 4.22–5.81)
RDW: 15 % (ref 11.5–15.5)
WBC: 10.6 10*3/uL — AB (ref 4.0–10.5)

## 2016-03-21 LAB — RAPID URINE DRUG SCREEN, HOSP PERFORMED
AMPHETAMINES: NOT DETECTED
BARBITURATES: NOT DETECTED
BENZODIAZEPINES: NOT DETECTED
COCAINE: POSITIVE — AB
Opiates: NOT DETECTED
TETRAHYDROCANNABINOL: NOT DETECTED

## 2016-03-21 LAB — ACETAMINOPHEN LEVEL

## 2016-03-21 LAB — SALICYLATE LEVEL: Salicylate Lvl: <7 mg/dL (ref 2.8–30.0)

## 2016-03-21 LAB — ETHANOL: Alcohol, Ethyl (B): 6 mg/dL — ABNORMAL HIGH (ref <5)
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
## 2016-03-21 NOTE — ED Triage Notes (Signed)
Patient reports he has a drug problem with crack and ETOH. Also, states he is experiencing intermittent suicidal thoughts. Pt reports he was walking down [facility], hoping someone would kill him by hitting him. Pt reports he has lost his job, apartment, and went through $7,000 in 1.5 months. Pt reports he spoke to his paster today and pt attempted to call different facilities for treatment but no available help.

## 2016-03-22 ENCOUNTER — Inpatient Hospital Stay (HOSPITAL_COMMUNITY)
Admission: AD | Admit: 2016-03-22 | Discharge: 2016-04-01 | DRG: 885 | Disposition: A | Discharge status: Home / Self Care | Payer: Federal, State, Local not specified - Other | Source: Intra-hospital | Attending: Psychiatry | Admitting: Psychiatry

## 2016-03-22 ENCOUNTER — Encounter (HOSPITAL_COMMUNITY): Payer: Self-pay | Admitting: *Deleted

## 2016-03-22 DIAGNOSIS — F332 Major depressive disorder, recurrent severe without psychotic features: Secondary | ICD-10-CM | POA: Diagnosis not present

## 2016-03-22 DIAGNOSIS — F419 Anxiety disorder, unspecified: Secondary | ICD-10-CM | POA: Diagnosis present

## 2016-03-22 DIAGNOSIS — Z590 Homelessness: Secondary | ICD-10-CM | POA: Diagnosis not present

## 2016-03-22 DIAGNOSIS — R45851 Suicidal ideations: Secondary | ICD-10-CM | POA: Diagnosis present

## 2016-03-22 DIAGNOSIS — F322 Major depressive disorder, single episode, severe without psychotic features: Secondary | ICD-10-CM | POA: Diagnosis present

## 2016-03-22 DIAGNOSIS — Z888 Allergy status to other drugs, medicaments and biological substances status: Secondary | ICD-10-CM | POA: Diagnosis not present

## 2016-03-22 DIAGNOSIS — F1420 Cocaine dependence, uncomplicated: Secondary | ICD-10-CM | POA: Diagnosis present

## 2016-03-22 DIAGNOSIS — F1994 Other psychoactive substance use, unspecified with psychoactive substance-induced mood disorder: Secondary | ICD-10-CM

## 2016-03-22 DIAGNOSIS — G4700 Insomnia, unspecified: Secondary | ICD-10-CM | POA: Diagnosis present

## 2016-03-22 DIAGNOSIS — Z886 Allergy status to analgesic agent status: Secondary | ICD-10-CM

## 2016-03-22 DIAGNOSIS — H9191 Unspecified hearing loss, right ear: Secondary | ICD-10-CM | POA: Diagnosis present

## 2016-03-22 DIAGNOSIS — Z8249 Family history of ischemic heart disease and other diseases of the circulatory system: Secondary | ICD-10-CM | POA: Diagnosis not present

## 2016-03-22 DIAGNOSIS — Z823 Family history of stroke: Secondary | ICD-10-CM | POA: Diagnosis not present

## 2016-03-22 DIAGNOSIS — F1424 Cocaine dependence with cocaine-induced mood disorder: Secondary | ICD-10-CM | POA: Diagnosis not present

## 2016-03-22 DIAGNOSIS — F1020 Alcohol dependence, uncomplicated: Secondary | ICD-10-CM | POA: Diagnosis present

## 2016-03-22 DIAGNOSIS — Z833 Family history of diabetes mellitus: Secondary | ICD-10-CM | POA: Diagnosis not present

## 2016-03-22 DIAGNOSIS — F329 Major depressive disorder, single episode, unspecified: Secondary | ICD-10-CM | POA: Diagnosis present

## 2016-03-22 DIAGNOSIS — Z9889 Other specified postprocedural states: Secondary | ICD-10-CM | POA: Diagnosis not present

## 2016-03-22 DIAGNOSIS — F17210 Nicotine dependence, cigarettes, uncomplicated: Secondary | ICD-10-CM | POA: Diagnosis present

## 2016-03-22 MED ORDER — ONDANSETRON 4 MG PO TBDP: 4.0000 mg | ORAL_TABLET | Freq: Four times a day (QID) | ORAL | Status: AC | PRN

## 2016-03-22 MED ORDER — VITAMIN B-1 100 MG PO TABS
100.0000 mg | ORAL_TABLET | Freq: Every day | ORAL | Status: DC
  Administered 2016-03-23 – 2016-03-29 (×2): 100 mg via ORAL
  Filled 2016-03-22 (×11): qty 1

## 2016-03-22 MED ORDER — LOPERAMIDE HCL 2 MG PO CAPS: 2.0000 mg | ORAL_CAPSULE | ORAL | Status: DC | PRN

## 2016-03-22 MED ORDER — TRAZODONE HCL 50 MG PO TABS
50.0000 mg | ORAL_TABLET | Freq: Every evening | ORAL | Status: DC | PRN
  Administered 2016-03-26 – 2016-03-29 (×5): 50 mg via ORAL
  Filled 2016-03-22 (×23): qty 1

## 2016-03-22 MED ORDER — ONDANSETRON 4 MG PO TBDP: 4.0000 mg | ORAL_TABLET | Freq: Four times a day (QID) | ORAL | Status: DC | PRN

## 2016-03-22 MED ORDER — MAGNESIUM HYDROXIDE 400 MG/5ML PO SUSP
30.0000 mL | Freq: Every day | ORAL | Status: DC | PRN
Start: 2016-03-22 — End: 2016-04-01

## 2016-03-22 MED ORDER — ZIPRASIDONE MESYLATE 20 MG IM SOLR: 20.0000 mg | INTRAMUSCULAR | Status: DC | PRN

## 2016-03-22 MED ORDER — ALUM & MAG HYDROXIDE-SIMETH 200-200-20 MG/5ML PO SUSP: 30.0000 mL | ORAL | Status: DC | PRN

## 2016-03-22 MED ORDER — LORAZEPAM 1 MG PO TABS: 1.0000 mg | ORAL_TABLET | ORAL | Status: DC | PRN

## 2016-03-22 MED ORDER — ADULT MULTIVITAMIN W/MINERALS CH: 1.0000 | ORAL_TABLET | Freq: Every day | ORAL | Status: DC

## 2016-03-22 MED ORDER — NICOTINE POLACRILEX 2 MG MT GUM
2.0000 mg | CHEWING_GUM | OROMUCOSAL | Status: DC | PRN
Start: 2016-03-22 — End: 2016-04-01
  Administered 2016-03-22 – 2016-03-23 (×2): 2 mg via ORAL
  Filled 2016-03-22: qty 5

## 2016-03-22 MED ORDER — LORAZEPAM 1 MG PO TABS: 1.0000 mg | ORAL_TABLET | Freq: Four times a day (QID) | ORAL | Status: AC | PRN

## 2016-03-22 MED ORDER — HYDROXYZINE HCL 25 MG PO TABS: 25.0000 mg | ORAL_TABLET | Freq: Four times a day (QID) | ORAL | Status: DC | PRN

## 2016-03-22 MED ORDER — THIAMINE HCL 100 MG/ML IJ SOLN
100.0000 mg | Freq: Once | INTRAMUSCULAR | Status: AC
  Administered 2016-03-22: 100 mg via INTRAMUSCULAR
  Filled 2016-03-22: qty 2

## 2016-03-22 MED ORDER — LOPERAMIDE HCL 2 MG PO CAPS: 2.0000 mg | ORAL_CAPSULE | ORAL | Status: AC | PRN

## 2016-03-22 MED ORDER — OLANZAPINE 5 MG PO TBDP: 5.0000 mg | ORAL_TABLET | Freq: Three times a day (TID) | ORAL | Status: DC | PRN

## 2016-03-22 MED ORDER — VITAMIN B-1 100 MG PO TABS
100.0000 mg | ORAL_TABLET | Freq: Every day | ORAL | Status: DC
  Filled 2016-03-22 (×2): qty 1

## 2016-03-22 MED ORDER — HYDROXYZINE HCL 25 MG PO TABS: 25.0000 mg | ORAL_TABLET | Freq: Four times a day (QID) | ORAL | Status: AC | PRN

## 2016-03-22 MED ORDER — THIAMINE HCL 100 MG/ML IJ SOLN: 100.0000 mg | Freq: Once | INTRAMUSCULAR | Status: DC

## 2016-03-22 MED ORDER — ADULT MULTIVITAMIN W/MINERALS CH
1.0000 | ORAL_TABLET | Freq: Every day | ORAL | Status: DC
  Administered 2016-03-22 – 2016-03-29 (×3): 1 via ORAL
  Filled 2016-03-22 (×13): qty 1

## 2016-03-22 NOTE — BH Assessment (Signed)
Tele Assessment Note   AUSITN MANY is an 56 y.o. 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 Pt reports his drug use has caused him to lose his family, his home, and his job.   Pt reports he attempted to commit suicide in 2000 after his wife divorced him and he lost his job and his house. Pt reports he has been struggling with drugs since 1983 and his longest period of sobriety was 3 years. When pt was asked about having access to weapons he stated "i'm glad I don't have a gun because if I did I would not be here right now."   Pt reports he has not been eating or sleeping for the past several days due to excessive drug use and was unable to contract for safety. Pt presented to be helpless and despondent during the assessment. Per Lindon Romp, FNP pt meets criteria for inpt treatment. [place] bed status is currently being investigated.  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[facility])    Quit Oct 2015   Wears glasses     Past Surgical History:  Procedure Laterality Date   CYSTOSCOPY WITH URETHRAL DILATATION N/A 10/31/2014   Procedure: CYSTOSCOPY WITH URETHRAL DILATATION;  Surgeon: Festus Aloe, MD;  Location: [facility];  Service: Urology;  Laterality: N/A;   TRANSURETHRAL  RESECTION OF BLADDER TUMOR N/A 10/31/2014   Procedure: TRANSURETHRAL RESECTION OF BLADDER TUMOR (TURBT);  Surgeon: Festus Aloe, MD;  Location: [facility];  Service: Urology;  Laterality: N/A;   TRANSURETHRAL RESECTION OF BLADDER TUMOR N/A 12/05/2014   Procedure: TRANSURETHRAL RESECTION OF BLADDER TUMOR (TURBT);  Surgeon: Festus Aloe, MD;  Location: [facility];  Service: Urology;  Laterality: N/A;    Family History:  Family History  Problem Relation Age of Onset   Diabetes Mother    Stroke Mother    Diabetes Father    Heart disease Father    Heart disease Brother    Heart disease Paternal Grandfather     Social History:  reports that he has been smoking Cigarettes.  He has a 40.00 pack-year smoking history. He has never used smokeless tobacco. He reports that he drinks alcohol. He reports that he uses drugs, including Cocaine.  Additional Social History:  Alcohol / Drug Use Pain Medications: See PTA meds  Prescriptions: See PTA meds  Over the Counter: See PTA meds  History of alcohol / drug use?: Yes Longest period of sobriety (when/how long): 3 years  Negative Consequences of Use: Personal relationships, Financial Withdrawal Symptoms: Agitation Substance #1 Name of Substance 1: Heroin 1 - Age of First Use: 56 y/o 1 - Amount (size/oz): 1 gram  1 - Frequency: daily 1 - Duration: ongoing 1 - Last Use / Amount: 5 days ago Substance #2 Name of Substance 2: Meth 2 - Age of First Use: 56 y/o 2 - Amount (size/oz): "$40 worth" 2 - Frequency: daily 2 - Duration: ongoing 2 - Last Use / Amount: 1 week ago Substance #3 Name of Substance 3: Crack 3 - Age of First Use: year 57 3 - Amount (size/oz): "as much as I can" 3 - Frequency: daily 3 - Duration: ongoing 3 - Last Use / Amount: today Substance #4 Name of Substance 4: Alcohol 4 - Age of First Use: 56 y/o 4 - Amount (size/oz): 1 beer 4 - Frequency: occasional 4 - Duration:  ongoing 4 - Last Use / Amount: today   CIWA: CIWA-Ar BP: 112/74 Pulse Rate: 72 COWS:    PATIENT STRENGTHS: (choose at least two) Capable of independent living Communication skills General fund of knowledge Motivation for treatment/growth  Allergies:  Allergies  Allergen Reactions   Acetaminophen Anaphylaxis   Advil Cold & Sinus Liqui-Gels [Pseudoephedrine-Ibuprofen] Anaphylaxis   Ibuprofen Anaphylaxis, Hives and Swelling         Home Medications:  (Not in a hospital admission)  OB/GYN Status:  No LMP for male patient.  General Assessment Data Location of Assessment: [facility] ED TTS Assessment: In system Is this a Tele or Face-to-Face Assessment?: Face-to-Face Is this an Initial Assessment or a Re-assessment for this encounter?: Initial Assessment Marital status: Divorced Is patient pregnant?: No Pregnancy Status: No Living Arrangements: Other (Comment) (pt reports he is homeless and living in his storage locker) Can pt return to current living arrangement?: Yes Admission Status: Voluntary Is patient capable of signing voluntary admission?: Yes Referral Source: Self/Family/Friend Insurance type: [place] Living Arrangements: Other (Comment) (pt reports he is homeless and living in his storage locker) Name of Psychiatrist: none Name of Therapist: none  Education Status Is patient currently in school?: No Highest grade of school patient has completed: 12th  Risk to self with the past 6 months Suicidal Ideation: Yes-Currently Present Has patient been a risk to self within the past 6 months prior to admission? : Yes Suicidal Intent: Yes-Currently Present Has patient had any suicidal intent within the past 6 months prior to admission? : Yes Is patient at risk for suicide?: Yes Suicidal Plan?: Yes-Currently Present Has patient had any suicidal plan within the past 6 months prior to admission? : Yes Specify Current Suicidal Plan: pt reports he was  trying to get run over by a car today  Access to Means: Yes Specify Access to Suicidal Means: pt has access to traffic  What has been your use of drugs/alcohol within the last 12 months?: reports to daily use of multiple drugs  Previous Attempts/Gestures: Yes How many times?: 1 Triggers for Past Attempts: Unpredictable Intentional Self Injurious Behavior: None Family Suicide History: No Recent stressful life event(s): Job Loss, Loss (Comment), Financial Problems (drug use has led to multiple losses for the pt ) Persecutory voices/beliefs?: No Depression: Yes Depression Symptoms: Despondent, Guilt, Feeling worthless/self pity, Loss of interest in usual pleasures Substance abuse history and/or treatment for substance abuse?: Yes Suicide prevention information given to non-admitted patients: Not applicable  Risk to Others within the past 6 months Homicidal Ideation: No Does patient have any lifetime risk of violence toward others beyond the six months prior to admission? : No Thoughts of Harm to Others: No Current Homicidal Intent: No Current Homicidal Plan: No  Access to Homicidal Means: No History of harm to others?: No Assessment of Violence: None Noted Does patient have access to weapons?: No Criminal Charges Pending?: No Does patient have a court date: No Is patient on probation?: No  Psychosis Hallucinations: None noted Delusions: None noted  Mental Status Report Appearance/Hygiene: Disheveled, In scrubs Eye Contact: Good Motor Activity: Freedom of movement Speech: Logical/coherent Level of Consciousness: Alert Mood: Despair, Depressed, Guilty, Helpless Affect: Depressed, Blunted Anxiety Level: Minimal Thought Processes: Coherent, Relevant Judgement: Impaired Orientation: Person, Place, Time, Appropriate for developmental age Obsessive Compulsive Thoughts/Behaviors: None  Cognitive Functioning Concentration: Normal Memory: Recent Intact, Remote Intact IQ:  Average Insight: Poor Impulse Control: Poor Appetite: Poor Sleep: Decreased Total Hours of Sleep: 0 (pt reports he has not slept in 2 days ) Vegetative Symptoms: None  ADLScreening [facility] Assessment Services) Patient's cognitive ability adequate to safely complete daily activities?: Yes Patient able to express need for assistance with ADLs?: Yes Independently performs ADLs?: Yes (appropriate for developmental age)  Prior Inpatient Therapy Prior Inpatient Therapy: No  Prior Outpatient Therapy Prior Outpatient Therapy: No Does patient have an ACCT team?: No Does patient have Intensive In-House Services?  : No Does patient have [facility] services? : No Does patient have P4CC services?: No  ADL Screening (condition at time of admission) Patient's cognitive ability adequate to safely complete daily activities?: Yes Is the patient deaf or have difficulty hearing?: No Does the patient have difficulty seeing, even when wearing glasses/contacts?: No Does the patient have difficulty concentrating, remembering, or making decisions?: No Patient able to express need for assistance with ADLs?: Yes Does the patient have difficulty dressing or bathing?: No Independently performs ADLs?: Yes (appropriate for developmental age) Does the patient have difficulty walking or climbing stairs?: No Weakness of Legs: None Weakness of Arms/Hands: None  Home Assistive Devices/Equipment Home Assistive Devices/Equipment: Eyeglasses    Abuse/Neglect Assessment (Assessment to be complete while patient is alone) Physical Abuse: Denies Verbal Abuse: Yes, past (Comment) (in childhood) Sexual Abuse: Denies Exploitation of patient/patient's resources: Denies Self-Neglect: Denies     Regulatory affairs officer (For Healthcare) Does Patient Have a Medical Advance Directive?: No Would patient like information on creating a medical advance directive?: No - Patient declined    Additional Information 1:1 In Past 12  Months?: No CIRT Risk: No Elopement Risk: No Does patient have medical clearance?: Yes     Disposition:  Disposition Initial Assessment Completed for this Encounter: Yes Disposition of Patient: Inpatient treatment program Type of inpatient treatment program: Adult (per Lindon Romp, FNP )  Lyanne Co 03/22/2016 1:02 AM

## 2016-03-22 NOTE — Progress Notes (Signed)
Adult Psychoeducational Group Note  Date:  03/22/2016 Time:  1315  Group Topic/Focus:  Coping With Mental Health Crisis:   The purpose of this group is to help patients identify strategies for coping with mental health crisis.  Group discusses possible causes of crisis and ways to manage them effectively.   Participation Level:  Did Not Attend  Participation Quality:    Affect:    Cognitive:    Insight:   Engagement in Group:    Modes of Intervention:    Additional Comments:   Emya Picado L 03/22/2016, 3:37 PM

## 2016-03-22 NOTE — Progress Notes (Signed)
Report received from admitting nurse. Patient denies SI/HI/AVH and pain at this time. Orders received and acknowledged.  Medications administered as per order. Patient verbally contracts for safety on the unit and agrees to come to staff before acting on any self harm thoughts/feelings and other needs or concerns. 15 min checks initiated for safety and patient oriented to the unit and the unit schedule. 

## 2016-03-22 NOTE — Progress Notes (Signed)
Admission Note:  56 year old male who presents voluntary, in no acute distress, for the treatment of SI and Substance Abuse.  Prior to admission, patient was SI and reports that he "stepped out in front of traffic on [address]" as a suicide attempt.  Patient appears flat and depressed. Patient was calm and cooperative with admission process. Patient presents with passive SI and contracts for safety upon admission. Patient denies AVH.  Patient reports that he relapsed a "few months ago" because he was "tired of being Dustin Esparza".  Patient reports that he has been suicidal for a week as a result of spending $7000 in a month on dugs. Patient reports that due to relapse he has no job, no place to live, and no money and has been living alone in a storage unit.  Patient reports crack, heroin, and meth use.  Patient reports prior suicide attempt in 2000 and states he cut wrist and took "a bunch of pills".  Patient identifies "[facility]" as his support system.  While at [facility], patient would like to "detox" and "Go to another rehab center, 30 days to a year, to get back on straight".  Skin was assessed and found to be clear of any abnormal marks apart from a scab on right side of neck and bump on buttocks.  Patient searched and no contraband found, POC and unit policies explained and understanding verbalized. Consents obtained. Food and fluids offered and accepted. Patient had no additional questions or concerns.

## 2016-03-22 NOTE — [facility] Group Notes (Signed)
  [facility] LCSW Group Therapy Note  03/22/2016  at  10:00 AM  Type of Therapy and Topic:  Group Therapy: Avoiding Self-Sabotaging and Enabling Behaviors  Participation Level:  Did Not Attend; invited to participate yet did not despite overhead announcement and encouragement by staff  Sheilah Pigeon, LCSW

## 2016-03-22 NOTE — H&P (Signed)
Psychiatric Admission Assessment Adult  Patient Identification: Dustin Esparza MRN:  [id number] Date of Evaluation:  03/22/2016 Chief Complaint:  " I relapsed 2 months ago after being clean for a while" Principal Diagnosis:  Cocaine Dependence, Substance Induced Mood Disorder, Depressed  Diagnosis:   Patient Active Problem List   Diagnosis Date Noted  . Major depressive disorder, single episode, severe without psychotic features ([place]) [F32.2] 03/22/2016   History of Present Illness: 56 year old male. Reports that after a period of two years of sobriety relapsed about two months ago. Identifies crack cocaine as substance of choice. Was using crack cocaine daily, and states " I have burned through $ 7,000 dollars ". To  a lesser degree has also used alcohol and occasionally heroin ( once a week or so) . States he was not drinking daily , 2-3 x per week ,and usually not to excess. States that in the context of relapse he has had a number of losses, mainly financial- lost job, apartment , and states he is now homeless. He reports worsening depression, with recent onset of suicidal ideations, with thoughts of being hit by a car . Associated Signs/Symptoms: Depression Symptoms:  depressed mood, anhedonia, insomnia, suicidal thoughts with specific plan, loss of energy/fatigue, decreased appetite, has lost about 30 lbs over the last 2-3 months (Hypo) Manic Symptoms:  Denies  Anxiety Symptoms:  Denies  Psychotic Symptoms:  Denies  PTSD Symptoms:denies   Total Time spent with patient: 45 minutes  Past Psychiatric History: no prior psychiatric admissions, one suicide attempt in 2000 by cutting wrist, denies history of violence, denies history of psychosis, denies history of mania, describes history of depression, which he feels is substance induced .   Is the patient at risk to self? Yes.    Has the patient been a risk to self in the past 6 months? No.  Has the patient been a risk to self  within the distant past? Yes.    Is the patient a risk to others? No.  Has the patient been a risk to others in the past 6 months? No.  Has the patient been a risk to others within the distant past? No.   Prior Inpatient Therapy:  denies  Prior Outpatient Therapy:  denies - had been in therapy a few years ago - [facility].   Alcohol Screening: 1. How often do you have a drink containing alcohol?: 4 or more times a week 2. How many drinks containing alcohol do you have on a typical day when you are drinking?: 5 or 6 3. How often do you have six or more drinks on one occasion?: Weekly Preliminary Score: 5 4. How often during the last year have you found that you were not able to stop drinking once you had started?: Monthly 5. How often during the last year have you failed to do what was normally expected from you becasue of drinking?: Never 6. How often during the last year have you needed a first drink in the morning to get yourself going after a heavy drinking session?: Never 7. How often during the last year have you had a feeling of guilt of remorse after drinking?: Monthly 8. How often during the last year have you been unable to remember what happened the night before because you had been drinking?: Never 9. Have you or someone else been injured as a result of your drinking?: No 10. Has a relative or friend or a doctor or another health worker been  concerned about your drinking or suggested you cut down?: Yes, during the last year Alcohol Use Disorder Identification Test Final Score (AUDIT): 17 Brief Intervention: Yes Substance Abuse History in the last 12 months:  History of cocaine dependence, history of alcohol dependence, but states " really I kind of switched over from alcohol to cocaine ". Denies history of opiate dependence, endorses occasional use of heroin. Remote IVDA in the 80's.  Consequences of Substance Abuse: Unemployment, homelessness, distant relationship with family  . Previous Psychotropic Medications: States he has never been on any psychiatric medications  Psychological Evaluations:  No  Past Medical History:   Past Medical History:  Diagnosis Date  . Bladder cancer ([place])   . Dysuria-frequency syndrome   . Hematuria   . History of drug use    Quit cocaine Oct 2015  . History of narcotic addiction ([place])   . History of seizure    due to reaction advil/tylenol gel capsule  . HOH (hard of hearing)    right ear  . 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 Wears glasses     Past Surgical History:  Procedure Laterality Date  . CYSTOSCOPY WITH URETHRAL DILATATION N/A 10/31/2014   Procedure: CYSTOSCOPY WITH URETHRAL DILATATION;  Surgeon: Festus Aloe, MD;  Location: [facility];  Service: Urology;  Laterality: N/A;  . TRANSURETHRAL RESECTION OF BLADDER TUMOR N/A 10/31/2014   Procedure: TRANSURETHRAL RESECTION OF BLADDER TUMOR (TURBT);  Surgeon: Festus Aloe, MD;  Location: [facility];  Service: Urology;  Laterality: N/A;  . TRANSURETHRAL RESECTION OF BLADDER TUMOR N/A 12/05/2014   Procedure: TRANSURETHRAL RESECTION OF BLADDER TUMOR (TURBT);  Surgeon: Festus Aloe, MD;  Location: [facility];  Service: Urology;  Laterality: N/A;   Family History: mother passed away 2 years ago, father alive , one brother, two sisters Family History  Problem Relation Age of Onset  . Diabetes Mother   . Stroke Mother   . Diabetes Father   . Heart disease Father   . Heart disease Brother   . Heart disease Paternal Grandfather    Family Psychiatric  History: no history of mental illness in family, no history of drug abuse or suicides in family Tobacco Screening: Have you used any form of tobacco in the last 30 days? (Cigarettes, Smokeless Tobacco, Cigars, and/or Pipes): Yes Tobacco use, Select all that apply: 5 or more cigarettes per day Are you interested in Tobacco Cessation Medications?: Yes, will  notify MD for an order Counseled patient on smoking cessation including recognizing danger situations, developing coping skills and basic information about quitting provided: Yes Social History: divorced, no children, currently homeless, had been employed until 11/17, currently unemployed, denies legal issues  History  Alcohol Use  . Yes    Comment: Daily. Last drink: 3 hours ago     History  Drug Use  . Types: Cocaine    Comment: Herion, Last used: yesterday    Additional Social History:  Allergies:   Allergies  Allergen Reactions  . Acetaminophen Anaphylaxis  . Advil Cold & Sinus Liqui-Gels [Pseudoephedrine-Ibuprofen] Anaphylaxis  . Ibuprofen Anaphylaxis, Hives and Swelling        Lab Results:  Results for orders placed or performed during the hospital encounter of 03/21/16 (from the past 48 hour(s))  Rapid urine drug screen (hospital performed)     Status: Abnormal   Collection Time: 03/21/16  9:27 PM  Result Value Ref Range   Opiates NONE DETECTED NONE DETECTED  Cocaine POSITIVE (A) NONE DETECTED   Benzodiazepines NONE DETECTED NONE DETECTED   Amphetamines NONE DETECTED NONE DETECTED   Tetrahydrocannabinol NONE DETECTED NONE DETECTED   Barbiturates NONE DETECTED NONE DETECTED    Comment:        DRUG SCREEN FOR MEDICAL PURPOSES ONLY.  IF CONFIRMATION IS NEEDED FOR ANY PURPOSE, NOTIFY LAB WITHIN 5 DAYS.        LOWEST DETECTABLE LIMITS FOR URINE DRUG SCREEN Drug Class       Cutoff (ng/mL) Amphetamine      1000 Barbiturate      200 Benzodiazepine   657 Tricyclics       846 Opiates          300 Cocaine          300 THC              50   Comprehensive metabolic panel     Status: Abnormal   Collection Time: 03/21/16 10:10 PM  Result Value Ref Range   Sodium 138 135 - 145 mmol/L   Potassium 3.6 3.5 - 5.1 mmol/L   Chloride 105 101 - 111 mmol/L   CO2 24 22 - 32 mmol/L   Glucose, Bld 105 (H) 65 - 99 mg/dL   BUN 9 6 - 20 mg/dL   Creatinine, Ser 0.78 0.61 - 1.24  mg/dL   Calcium 8.8 (L) 8.9 - 10.3 mg/dL   Total Protein 6.7 6.5 - 8.1 g/dL   Albumin 3.8 3.5 - 5.0 g/dL   AST 20 15 - 41 U/L   ALT 20 17 - 63 U/L   Alkaline Phosphatase 61 38 - 126 U/L   Total Bilirubin 0.7 0.3 - 1.2 mg/dL   GFR calc non Af Amer >60 >60 mL/min   GFR calc Af Amer >60 >60 mL/min    Comment: (NOTE) The eGFR has been calculated using the CKD EPI equation. This calculation has not been validated in all clinical situations. eGFR's persistently <60 mL/min signify possible Chronic Kidney Disease.    Anion gap 9 5 - 15  Ethanol     Status: Abnormal   Collection Time: 03/21/16 10:10 PM  Result Value Ref Range   Alcohol, Ethyl (B) 6 (H) <5 mg/dL    Comment:        LOWEST DETECTABLE LIMIT FOR SERUM ALCOHOL IS 5 mg/dL FOR MEDICAL PURPOSES ONLY   Salicylate level     Status: None   Collection Time: 03/21/16 10:10 PM  Result Value Ref Range   Salicylate Lvl <9.6 2.8 - 30.0 mg/dL  Acetaminophen level     Status: Abnormal   Collection Time: 03/21/16 10:10 PM  Result Value Ref Range   Acetaminophen (Tylenol), Serum <10 (L) 10 - 30 ug/mL    Comment:        THERAPEUTIC CONCENTRATIONS VARY SIGNIFICANTLY. A RANGE OF 10-30 ug/mL MAY BE AN EFFECTIVE CONCENTRATION FOR MANY PATIENTS. HOWEVER, SOME ARE BEST TREATED AT CONCENTRATIONS OUTSIDE THIS RANGE. ACETAMINOPHEN CONCENTRATIONS >150 ug/mL AT 4 HOURS AFTER INGESTION AND >50 ug/mL AT 12 HOURS AFTER INGESTION ARE OFTEN ASSOCIATED WITH TOXIC REACTIONS.   cbc     Status: Abnormal   Collection Time: 03/21/16 10:10 PM  Result Value Ref Range   WBC 10.6 (H) 4.0 - 10.5 K/uL   RBC 4.65 4.22 - 5.81 MIL/uL   Hemoglobin 14.4 13.0 - 17.0 g/dL   HCT 41.4 39.0 - 52.0 %   MCV 89.0 78.0 - 100.0 fL   MCH 31.0 26.0 -  34.0 pg   MCHC 34.8 30.0 - 36.0 g/dL   RDW 15.0 11.5 - 15.5 %   Platelets 309 150 - 400 K/uL    Blood Alcohol level:  Lab Results  Component Value Date   ETH 6 (H) 40/98/1191    Metabolic Disorder Labs:  No  results found for: HGBA1C, MPG No results found for: PROLACTIN No results found for: CHOL, TRIG, HDL, CHOLHDL, VLDL, LDLCALC  Current Medications: Current Facility-Administered Medications  Medication Dose Route Frequency Provider Last Rate Last Dose  . alum & mag hydroxide-simeth (MAALOX/MYLANTA) 200-200-20 MG/5ML suspension 30 mL  30 mL Oral Q4H PRN Patrecia Pour, NP      . hydrOXYzine (ATARAX/VISTARIL) tablet 25 mg  25 mg Oral Q6H PRN Nanci Pina, FNP      . loperamide (IMODIUM) capsule 2-4 mg  2-4 mg Oral PRN Nanci Pina, FNP      . OLANZapine zydis (ZYPREXA) disintegrating tablet 5 mg  5 mg Oral Q8H PRN Nanci Pina, FNP       And  . LORazepam (ATIVAN) tablet 1 mg  1 mg Oral PRN Nanci Pina, FNP       And  . ziprasidone (GEODON) injection 20 mg  20 mg Intramuscular PRN Nanci Pina, FNP      . magnesium hydroxide (MILK OF MAGNESIA) suspension 30 mL  30 mL Oral Daily PRN Patrecia Pour, NP      . multivitamin with minerals tablet 1 tablet  1 tablet Oral Daily Nanci Pina, FNP      . nicotine polacrilex (NICORETTE) gum 2 mg  2 mg Oral PRN Nanci Pina, FNP      . ondansetron (ZOFRAN-ODT) disintegrating tablet 4 mg  4 mg Oral Q6H PRN Nanci Pina, FNP      . thiamine (B-1) injection 100 mg  100 mg Intramuscular Once Nanci Pina, FNP      . [START ON 03/23/2016] thiamine (VITAMIN B-1) tablet 100 mg  100 mg Oral Daily Nanci Pina, FNP      . traZODone (DESYREL) tablet 50 mg  50 mg Oral QHS,MR X 1 Nanci Pina, FNP       PTA Medications: No prescriptions prior to admission.    Musculoskeletal: Strength & Muscle Tone: within normal limits Gait & Station: normal Patient leans: N/A  Psychiatric Specialty Exam: Physical Exam  Review of Systems  Constitutional: Negative.   HENT: Negative.   Eyes: Negative.   Respiratory: Negative.   Cardiovascular: Negative.   Gastrointestinal: Negative.  Negative for blood in stool.  Genitourinary: Negative.   Negative for dysuria, flank pain, frequency, hematuria and urgency.       Has history of bladder cancer, states currently in remission, denies any hematuria  Musculoskeletal: Negative.   Skin: Negative.   Neurological: Negative for seizures.  Endo/Heme/Allergies: Negative.   Psychiatric/Behavioral: Positive for depression, substance abuse and suicidal ideas.  All other systems reviewed and are negative.   Blood pressure 105/73, pulse 77, temperature 98.3 F (36.8 C), temperature source Oral, resp. rate 16, height _0  (1.753 m), weight 71.7 kg (158 lb), SpO2 97 %.Body mass index is 23.33 kg/m.  General Appearance: Fairly Groomed  Eye Contact:  Fair  Speech:  Normal Rate  Volume:  Decreased  Mood:  Depressed and Dysphoric  Affect:  Constricted  Thought Process:  Linear  Orientation:  Other:  fully alert and attentive   Thought Content:  denies halucinations, no  delusions, not internally preoccupied   Suicidal Thoughts:  No denies any current suicidal or self injurious ideations .  Homicidal Thoughts:  denies any homicidal or violent ideations   Memory:  recent and remote grossly intact   Judgement:  Fair  Insight:  Fair  Psychomotor Activity:  Decreased- no tremors, no diaphoresis, no acute distress or agitation   Concentration:  Concentration: Good and Attention Span: Good  Recall:  Good  Fund of Knowledge:  Good  Language:  Good  Akathisia:  Negative  Handed:  Right  AIMS (if indicated):     Assets:  Communication Skills Desire for Improvement Resilience  ADL's:  Fair   Cognition:  WNL  Sleep:       Treatment Plan Summary: Daily contact with patient to assess and evaluate symptoms and progress in treatment, Medication management, Plan inpatient admission  and medications as below  Observation Level/Precautions:  15 minute checks  Laboratory:  As needed   Psychotherapy:  Milieu, group therapy   Medications:  We discussed options such as starting antidepressant  medication- at this time patient reports he feels his depression is substance induced and states " let's wait and see how I feel as I clean up". Trazodone PRNs for insomnia Ativan PRNs for potential alcohol WDL  Vistaril PRNs for anxiety as needed    Consultations:  As needed   Discharge Concerns:  -homelessness-  Estimated LOS:5 days   Other:  States he is interested in going to a rehab on discharge   Physician Treatment Plan for Primary Diagnosis:  Cocaine Dependence Long Term Goal(s): Improvement in symptoms so as ready for discharge  Short Term Goals: Ability to identify triggers associated with substance abuse/mental health issues will improve  Physician Treatment Plan for Secondary Diagnosis: Substance Induced Mood Disorder, Depressed  Long Term Goal(s): Improvement in symptoms so as ready for discharge  Short Term Goals: Ability to verbalize feelings will improve, Ability to disclose and discuss suicidal ideas, Ability to demonstrate self-control will improve and Ability to identify and develop effective coping behaviors will improve  I certify that inpatient services furnished can reasonably be expected to improve the patient's condition.    Neita Garnet, MD 1/13/20184:24 PM

## 2016-03-22 NOTE — BHH Suicide Risk Assessment (Signed)
[facility] Admission Suicide Risk Assessment   Nursing information obtained from:  Patient Demographic factors:  Male, Divorced or widowed, Caucasian, Low socioeconomic status, Living alone, Unemployed Current Mental Status:  Suicidal ideation indicated by patient, Suicide plan, Plan includes specific time, place, or method, Self-harm thoughts, Self-harm behaviors, Intention to act on suicide plan Loss Factors:  Decrease in vocational status, Financial problems / change in socioeconomic status Historical Factors:  Prior suicide attempts Risk Reduction Factors:  Positive social support  Total Time spent with patient: 45 minutes Principal Problem:  Cocaine Dependence, cocaine induced mood disorder  Diagnosis:   Patient Active Problem List   Diagnosis Date Noted  . Major depressive disorder, single episode, severe without psychotic features ([place]) [F32.2] 03/22/2016    Continued Clinical Symptoms:  Alcohol Use Disorder Identification Test Final Score (AUDIT): 17 The "Alcohol Use Disorders Identification Test", Guidelines for Use in Primary Care, Second Edition.  World Pharmacologist [facility]). Score between 0-7:  no or low risk or alcohol related problems. Score between 8-15:  moderate risk of alcohol related problems. Score between 16-19:  high risk of alcohol related problems. Score 20 or above:  warrants further diagnostic evaluation for alcohol dependence and treatment.   CLINICAL FACTORS:  56 year old male, reports relapsing on cocaine after a period of 2 years of sobriety. Reports daily crack cocaine use, with resulting loss of job, apartment, family support. Reports drinking several times a week but usually not to excess and does not present with any alcohol WDL symptoms. Reports worsening depression, and recent suicidal ideations.     Psychiatric Specialty Exam: Physical Exam  ROS  Blood pressure 105/73, pulse 77, temperature 98.3 F (36.8 C), temperature source Oral, resp. rate  16, height 5\' 9"  (1.753 m), weight 71.7 kg (158 lb), SpO2 97 %.Body mass index is 23.33 kg/m.   refer to admit note MSE   COGNITIVE FEATURES THAT CONTRIBUTE TO RISK:  Closed-mindedness and Loss of executive function    SUICIDE RISK:   Mild:  Suicidal ideation of limited frequency, intensity, duration, and specificity.  There are no identifiable plans, no associated intent, mild dysphoria and related symptoms, good self-control (both objective and subjective assessment), few other risk factors, and identifiable protective factors, including available and accessible social support.   PLAN OF CARE: Patient will be admitted to inpatient psychiatric unit for stabilization and safety. Will provide and encourage milieu participation. Provide medication management and maked adjustments as needed.  Will follow daily.    I certify that inpatient services furnished can reasonably be expected to improve the patient's condition.  Neita Garnet, MD 03/22/2016, 4:50 PM

## 2016-03-22 NOTE — Plan of Care (Signed)
Problem: Safety: Goal: Ability to remain free from injury will improve Outcome: Progressing Patient has remained free from injury this shift.   

## 2016-03-22 NOTE — ED Notes (Signed)
SBAR Report received from previous nurse. Pt received calm and visible on unit. Pt denies current SI/ HI, A/V H, depression, anxiety, or pain at this time, and appears otherwise stable and free of distress. Pt reminded of camera surveillance, q 15 min rounds, and rules of the milieu. Will continue to assess. 

## 2016-03-22 NOTE — BH Assessment (Addendum)
Pt has been accepted [facility] 301-1 after 08:00 on 03/22/16 per Shana Chute, RN . Support paperwork to be completed. Bethena Roys, RN has been notified.  Lind Covert, MSW, Latanya Presser

## 2016-03-22 NOTE — Tx Team (Signed)
Initial Treatment Plan 03/22/2016 11:39 AM [name] BE:[id number]    PATIENT STRESSORS: Financial difficulties Occupational concerns Substance abuse Other: Housing concerns   PATIENT STRENGTHS: Active sense of humor Average or above average intelligence Communication skills Motivation for treatment/growth Physical Health   PATIENT IDENTIFIED PROBLEMS: At risk for suicide  Substance Abuse  Depression  "Detox"  "Go to another Rehab center; 30 days to year to get head back on straight"             DISCHARGE CRITERIA:  Ability to meet basic life and health needs Adequate post-discharge living arrangements Improved stabilization in mood, thinking, and/or behavior Motivation to continue treatment in a less acute level of care Need for constant or close observation no longer present Withdrawal symptoms are absent or subacute and managed without 24-hour nursing intervention  PRELIMINARY DISCHARGE PLAN: Attend 12-step recovery group Outpatient therapy Placement in alternative living arrangements  PATIENT/FAMILY INVOLVEMENT: This treatment plan has been presented to and reviewed with the patient, Dustin Esparza.  The patient and family have been given the opportunity to ask questions and make suggestions.  Dustin Flock, RN 03/22/2016, 11:39 AM

## 2016-03-22 NOTE — Progress Notes (Signed)
[facility] Notes:  (Nursing/MHT/Case Management/Adjunct)  Date:  03/22/2016  Time:  2045  Type of Therapy:  wrap up group  Participation Level:  Active  Participation Quality:  Appropriate, Attentive, Sharing and Supportive  Affect:  Flat  Cognitive:  Appropriate  Insight:  Improving  Engagement in Group:  Engaged  Modes of Intervention:  Clarification, Education and Support  Summary of Progress/Problems: Pt shared that if he could change one thing in his life it would be the desire to use and patient is grateful for being here.   Shellia Cleverly 03/22/2016, 10:16 PM

## 2016-03-22 NOTE — ED Notes (Signed)
Pt transported to [facility] by [facility]. All belongings returned to pt who signed for same.

## 2016-03-23 MED ORDER — NICOTINE 21 MG/24HR TD PT24
21.0000 mg | MEDICATED_PATCH | Freq: Every day | TRANSDERMAL | Status: DC
  Filled 2016-03-23 (×2): qty 1

## 2016-03-23 NOTE — Progress Notes (Addendum)
Nursing Progress Note 7p-7a  D) Patient presents with flat affect and is irritable. Patient did attend AA group but is isolative to room during free time. When nurse attempts to engage with patient, patient is minimal and guarded. Patient currently in room and states "I am trying to sleep". Patient denies SI/HI/AVH or pain. Patient contracts for safety at this time. Patient refuses PM trazodone and declines need for any PRN medications.  A) Emotional support given. Patient on q15 min safety checks. Opportunities for questions or concerns presented to patient. Patient encouraged to continue to work on treatment goals.  R) Patient remains safe on the unit at this time. Patient is resting in bed without complaints. Will continue to monitor.

## 2016-03-23 NOTE — Progress Notes (Signed)
Nursing Progress Note: 7p-7a D: Pt currently presents with a flat/depressed/tired affect and behavior. Pt states "nothing is wrong with me I just want to sleep." Interacting appropriately with milieu. Pt reports good sleep with current medication regimen.   A: Pt refused all night time medications. Pt's labs and vitals were monitored throughout the night. Pt supported emotionally and encouraged to express concerns and questions. Pt educated on medications.  R: Pt's safety ensured with 15 minute and environmental checks. Pt currently denies SI/HI/Self Harm and A/V hallucinations. Pt verbally contracts to seek staff if SI/HI or A/VH occurs and to consult with staff before acting on any harmful thoughts. Will continue to monitor.
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
## 2016-03-23 NOTE — Progress Notes (Signed)
Patient did attend the evening speaker AA meeting.  

## 2016-03-23 NOTE — BHH Group Notes (Signed)
[place] LCSW Group Therapy Note   03/23/2016  10:00 until 11:00 AM   Type of Therapy and Topic: Group Therapy: Feelings Around Returning Home & Establishing a Supportive Framework and Activity to Identify signs of Improvement or Decompensation   Participation Level:  Did not attend; invited to participate yet did not despite overhead announcement and encouragement by staff   Sheilah Pigeon, LCSW

## 2016-03-23 NOTE — Progress Notes (Signed)
[facility] MD Progress Note  03/23/2016 9:55 AM Dustin Esparza  MRN:  [id number]   Subjective:  It went alright. Im not having any withdraw symptoms. Went to group yesterday didn't know that they had groups in the morning or I would have went. I showered and shaved that made me feel better. I got to catch on me sleep, havent slept in a week been on dope for the past week.   Per nursing:Pt currently presents with a flat/depressed/tired affect and behavior. Pt states "nothing is wrong with me I just want to sleep." Interacting appropriately with milieu. Pt reports good sleep with current medication regimen.  Pt refused all night time medications. Pt's labs and vitals were monitored throughout the night. Pt supported emotionally and encouraged to express concerns and questions. Pt educated on medications.   Objective:  Patient was seen and chart reviewed. Patient stated that he is a 56 years old single Caucasian male admitted voluntarily  [facility] for depression, anxiety, substance abuse and suicidal thoughts. .   During today's assessment, pt reported good sleep" I needed it" and good appetite"its coming back to me" . Currently rates depression at 7/10 but anxiety at 10/10. Pt denies any depressive or anxiety symptoms at this time. He has also declined any medication for depression and or anxiety at this time. Pt denies SI, HI, and AVH, but states that he has not improved in the last 24 hours. Does contract for safety.   Principal Problem: Major depressive disorder, single episode, severe without psychotic features ([place]) Diagnosis:   Patient Active Problem List   Diagnosis Date Noted  . Major depressive disorder, single episode, severe without psychotic features ([place]) [F32.2] 03/22/2016   Total Time spent with patient: 20 minutes  Past Psychiatric History:no prior psychiatric admissions, one suicide attempt in 2000 by cutting wrist, denies history of violence, denies  history of psychosis, denies history of mania, describes history of depression, which he feels is substance induced.   Substance Abuse History in the last 12 months:  History of cocaine dependence, history of alcohol dependence, but states " really I kind of switched over from alcohol to cocaine ". Denies history of opiate dependence, endorses occasional use of heroin. Remote IVDA in the 80's.   Consequences of Substance Abuse: Unemployment, homelessness, distant relationship with family.  Previous Psychotropic Medications: States he has never been on any psychiatric medications.   Past Medical History:  Past Medical History:  Diagnosis Date  . Bladder cancer ([place])   . Dysuria-frequency syndrome   . Hematuria   . History of drug use    Quit cocaine Oct 2015  . History of narcotic addiction ([place])   . History of seizure    due to reaction advil/tylenol gel capsule  . HOH (hard of hearing)    right ear  . Recovering alcoholic [facility])    Quit Oct 2015  . Wears glasses     Past Surgical History:  Procedure Laterality Date  . CYSTOSCOPY WITH URETHRAL DILATATION N/A 10/31/2014   Procedure: CYSTOSCOPY WITH URETHRAL DILATATION;  Surgeon: Festus Aloe, MD;  Location: [facility];  Service: Urology;  Laterality: N/A;  . TRANSURETHRAL RESECTION OF BLADDER TUMOR N/A 10/31/2014   Procedure: TRANSURETHRAL RESECTION OF BLADDER TUMOR (TURBT);  Surgeon: Festus Aloe, MD;  Location: [facility];  Service: Urology;  Laterality: N/A;  . TRANSURETHRAL RESECTION OF BLADDER TUMOR N/A 12/05/2014   Procedure: TRANSURETHRAL RESECTION OF BLADDER TUMOR (TURBT);  Surgeon:  Festus Aloe, MD;  Location: [facility];  Service: Urology;  Laterality: N/A;   Family History:  Family History  Problem Relation Age of Onset  . Diabetes Mother   . Stroke Mother   . Diabetes Father   . Heart disease Father   . Heart disease Brother   . Heart disease Paternal  Grandfather    Family Psychiatric  History: Denies Social History:  History  Alcohol Use  . Yes    Comment: Daily. Last drink: 3 hours ago     History  Drug Use  . Types: Cocaine    Comment: Herion, Last used: yesterday    Social History   Social History  . Marital status: Divorced    Spouse name: N/A  . Number of children: N/A  . Years of education: N/A   Social History Main Topics  . Smoking status: Current Every Day Smoker    Packs/day: 1.00    Years: 40.00    Types: Cigarettes  . Smokeless tobacco: Never Used  . Alcohol use Yes     Comment: Daily. Last drink: 3 hours ago  . Drug use:     Types: Cocaine     Comment: Herion, Last used: yesterday  . Sexual activity: Not Asked   Other Topics Concern  . None   Social History Narrative  . None   Additional Social History:      Sleep: Good  Appetite:  Fair  Current Medications: Current Facility-Administered Medications  Medication Dose Route Frequency Provider Last Rate Last Dose  . alum & mag hydroxide-simeth (MAALOX/MYLANTA) 200-200-20 MG/5ML suspension 30 mL  30 mL Oral Q4H PRN Patrecia Pour, NP      . hydrOXYzine (ATARAX/VISTARIL) tablet 25 mg  25 mg Oral Q6H PRN Jenne Campus, MD      . loperamide (IMODIUM) capsule 2-4 mg  2-4 mg Oral PRN Jenne Campus, MD      . LORazepam (ATIVAN) tablet 1 mg  1 mg Oral Q6H PRN Myer Peer Khilee Hendricksen, MD      . magnesium hydroxide (MILK OF MAGNESIA) suspension 30 mL  30 mL Oral Daily PRN Patrecia Pour, NP      . multivitamin with minerals tablet 1 tablet  1 tablet Oral Daily Nanci Pina, FNP   1 tablet at 03/22/16 1700  . nicotine (NICODERM CQ - dosed in mg/24 hours) patch 21 mg  21 mg Transdermal Daily Rozetta Nunnery, NP      . nicotine polacrilex (NICORETTE) gum 2 mg  2 mg Oral PRN Nanci Pina, FNP   2 mg at 03/22/16 1658  . OLANZapine zydis (ZYPREXA) disintegrating tablet 5 mg  5 mg Oral Q8H PRN Nanci Pina, FNP      . ondansetron (ZOFRAN-ODT)  disintegrating tablet 4 mg  4 mg Oral Q6H PRN Jenne Campus, MD      . thiamine (VITAMIN B-1) tablet 100 mg  100 mg Oral Daily Myer Peer Meha Vidrine, MD      . traZODone (DESYREL) tablet 50 mg  50 mg Oral QHS,MR X 1 Nanci Pina, FNP        Lab Results:  Results for orders placed or performed during the hospital encounter of 03/21/16 (from the past 48 hour(s))  Rapid urine drug screen (hospital performed)     Status: Abnormal   Collection Time: 03/21/16  9:27 PM  Result Value Ref Range   Opiates NONE DETECTED NONE DETECTED   Cocaine  POSITIVE (A) NONE DETECTED   Benzodiazepines NONE DETECTED NONE DETECTED   Amphetamines NONE DETECTED NONE DETECTED   Tetrahydrocannabinol NONE DETECTED NONE DETECTED   Barbiturates NONE DETECTED NONE DETECTED    Comment:        DRUG SCREEN FOR MEDICAL PURPOSES ONLY.  IF CONFIRMATION IS NEEDED FOR ANY PURPOSE, NOTIFY LAB WITHIN 5 DAYS.        LOWEST DETECTABLE LIMITS FOR URINE DRUG SCREEN Drug Class       Cutoff (ng/mL) Amphetamine      1000 Barbiturate      200 Benzodiazepine   350 Tricyclics       093 Opiates          300 Cocaine          300 THC              50   Comprehensive metabolic panel     Status: Abnormal   Collection Time: 03/21/16 10:10 PM  Result Value Ref Range   Sodium 138 135 - 145 mmol/L   Potassium 3.6 3.5 - 5.1 mmol/L   Chloride 105 101 - 111 mmol/L   CO2 24 22 - 32 mmol/L   Glucose, Bld 105 (H) 65 - 99 mg/dL   BUN 9 6 - 20 mg/dL   Creatinine, Ser 0.78 0.61 - 1.24 mg/dL   Calcium 8.8 (L) 8.9 - 10.3 mg/dL   Total Protein 6.7 6.5 - 8.1 g/dL   Albumin 3.8 3.5 - 5.0 g/dL   AST 20 15 - 41 U/L   ALT 20 17 - 63 U/L   Alkaline Phosphatase 61 38 - 126 U/L   Total Bilirubin 0.7 0.3 - 1.2 mg/dL   GFR calc non Af Amer >60 >60 mL/min   GFR calc Af Amer >60 >60 mL/min    Comment: (NOTE) The eGFR has been calculated using the CKD EPI equation. This calculation has not been validated in all clinical situations. eGFR's  persistently <60 mL/min signify possible Chronic Kidney Disease.    Anion gap 9 5 - 15  Ethanol     Status: Abnormal   Collection Time: 03/21/16 10:10 PM  Result Value Ref Range   Alcohol, Ethyl (B) 6 (H) <5 mg/dL    Comment:        LOWEST DETECTABLE LIMIT FOR SERUM ALCOHOL IS 5 mg/dL FOR MEDICAL PURPOSES ONLY   Salicylate level     Status: None   Collection Time: 03/21/16 10:10 PM  Result Value Ref Range   Salicylate Lvl <8.1 2.8 - 30.0 mg/dL  Acetaminophen level     Status: Abnormal   Collection Time: 03/21/16 10:10 PM  Result Value Ref Range   Acetaminophen (Tylenol), Serum <10 (L) 10 - 30 ug/mL    Comment:        THERAPEUTIC CONCENTRATIONS VARY SIGNIFICANTLY. A RANGE OF 10-30 ug/mL MAY BE AN EFFECTIVE CONCENTRATION FOR MANY PATIENTS. HOWEVER, SOME ARE BEST TREATED AT CONCENTRATIONS OUTSIDE THIS RANGE. ACETAMINOPHEN CONCENTRATIONS >150 ug/mL AT 4 HOURS AFTER INGESTION AND >50 ug/mL AT 12 HOURS AFTER INGESTION ARE OFTEN ASSOCIATED WITH TOXIC REACTIONS.   cbc     Status: Abnormal   Collection Time: 03/21/16 10:10 PM  Result Value Ref Range   WBC 10.6 (H) 4.0 - 10.5 K/uL   RBC 4.65 4.22 - 5.81 MIL/uL   Hemoglobin 14.4 13.0 - 17.0 g/dL   HCT 41.4 39.0 - 52.0 %   MCV 89.0 78.0 - 100.0 fL   MCH 31.0 26.0 - 34.0  pg   MCHC 34.8 30.0 - 36.0 g/dL   RDW 15.0 11.5 - 15.5 %   Platelets 309 150 - 400 K/uL    Blood Alcohol level:  Lab Results  Component Value Date   ETH 6 (H) 41/32/4401    Metabolic Disorder Labs: No results found for: HGBA1C, MPG No results found for: PROLACTIN No results found for: CHOL, TRIG, HDL, CHOLHDL, VLDL, LDLCALC  Physical Findings: AIMS: Facial and Oral Movements Muscles of Facial Expression: None, normal Lips and Perioral Area: None, normal Jaw: None, normal Tongue: None, normal,Extremity Movements Upper (arms, wrists, hands, fingers): None, normal Lower (legs, knees, ankles, toes): None, normal, Trunk Movements Neck, shoulders,  hips: None, normal, Overall Severity Severity of abnormal movements (highest score from questions above): None, normal Incapacitation due to abnormal movements: None, normal Patient's awareness of abnormal movements (rate only patient's report): No Awareness, Dental Status Current problems with teeth and/or dentures?: No Does patient usually wear dentures?: No  CIWA:  CIWA-Ar Total: 0 COWS:     Musculoskeletal: Strength & Muscle Tone: within normal limits Gait & Station: normal Patient leans: N/A  Psychiatric Specialty Exam: Physical Exam  ROS  Blood pressure 116/71, pulse (!) 58, temperature 97.9 F (36.6 C), resp. rate 16, height '5\' 9"'  (1.753 m), weight 71.7 kg (158 lb), SpO2 97 %.Body mass index is 23.33 kg/m.  General Appearance: Fairly Groomed  Eye Contact:  Fair  Speech:  Normal Rate  Volume:  Decreased  Mood:  Depressed and Irritable  Affect:  Depressed and Restricted  Thought Process:  Linear and Descriptions of Associations: Intact  Orientation:  Full (Time, Place, and Person)  Thought Content:  Logical  Suicidal Thoughts:  Yes.  with intent/plan  Homicidal Thoughts:  No  Memory:  Immediate;   Fair Recent;   Fair  Judgement:  Impaired  Insight:  Lacking  Psychomotor Activity:  Decreased  Concentration:  Concentration: Fair and Attention Span: Poor  Recall:  [employer] of Knowledge:  Fair  Language:  Fair  Akathisia:  No  Handed:  Right  AIMS (if indicated):     Assets:  Communication Skills Desire for Improvement Financial Resources/Insurance Leisure Time Physical Health Social Support Talents/Skills Vocational/Educational  ADL's:  Intact  Cognition:  WNL  Sleep:  Number of Hours: 6     Treatment Plan Summary: Daily contact with patient to assess and evaluate symptoms and progress in treatment and Medication management   Plan: 1. Patient was admitted to the Adult  unit at [facility] under the service of Dr. Parke Poisson. 2.   Routine labs, which include CBC, CMP, UDS, UA, and medical consultation were reviewed and routine PRN's were ordered for the patient. 3. Will maintain Q 15 minutes observation for safety.  Estimated LOS:  3-5 days 4. During this hospitalization the patient will receive psychosocial  Assessment. 5. Patient will participate in  group, milieu, and family therapy. Psychotherapy: Social and Proofreader training,substance abuse, learning based strategies, cognitive behavioral, and family object relations individuation separation intervention psychotherapies can be considered.  6. To reduce current symptoms to base line and improve the patient's overall level of functioning will adjust Medication management as follow: 7. Dustin Esparza was educated about medication efficacy and side effects. Dustin Esparza was educated about medication efficacy, risk factors for suicide, and treatment options. No psychotropic medications will be started at this time. Patient does endorsed acute psychiatric symptoms, however is against pharmacological management as he states his depression is  substance induced. Patient verbalizes not wanting to be on psychotropic medications since he the depression is substance induced. We'll continue to monitor patient's mood and behavior to further assess the need for psychotropic medication. At this point patient will be monitored for recurrence of suicidal ideation. 8. Will continue to monitor patient's mood and behavior. 9. Social Work will schedule a Family meeting to obtain collateral information and discuss discharge and follow up plan.  Discharge concerns will also be addressed:  Safety, stabilization, and access to medication 10. Discharge planning in progress, will work with social work for discharge and placement.  Nanci Pina, FNP 03/23/2016, 9:55 AM   Agree with NP Progress Note

## 2016-03-23 NOTE — BHH Group Notes (Signed)
[place] Group Notes:  (Nursing/MHT/Case Management/Adjunct)  Date:  03/23/2016  Time:  2:41 PM  Type of Therapy:  Nurse Education  Participation Level:  Did Not Attend   Cheri Kearns 03/23/2016, 2:41 PM

## 2016-03-23 NOTE — Plan of Care (Signed)
Problem: Coping: Goal: Ability to verbalize frustrations and anger appropriately will improve Outcome: Not Progressing Patient unable to engage with writer or on previous shift as reported. Patient is minimal and guarded.  Problem: Safety: Goal: Periods of time without injury will increase Outcome: Progressing Patient remains safe on the unit at this time. Patient is on q15 min safety checks, contracts for safety and is a low falls risk.

## 2016-03-23 NOTE — BHH Suicide Risk Assessment (Signed)
[place] INPATIENT:  Family/Significant Other Suicide Prevention Education  Suicide Prevention Education:  Patient Refusal for Family/Significant Other Suicide Prevention Education: The patient Dustin Esparza has refused to provide written consent for family/significant other to be provided Family/Significant Other Suicide Prevention Education during admission and/or prior to discharge.  Physician notified.  Suicide Prevention Education was reviewed with patient 1:1 and he was given a brochure to keep.  Berlin Hun Grossman-Orr 03/23/2016, 2:17 PM

## 2016-03-23 NOTE — Progress Notes (Signed)
Data. Patient denies SI/HI/AVH. Patient isolated in his room through out the morning. Was in the milieu watch ing the football game in the afternoon, but did not interact. Does not initiate interaction and gives minimal responses and head movements in response to questions. Affect is blunt and patient presents guarded. On his self assessment he reports 7/10 for depression and 5/10 for hopelessness and 0/1`0 for anxiety. His goal for today is: "Try to stay out of bed." Action. Emotional support and encouragement offered. Education provided on medication, indications and side effect. Q 15 minute checks done for safety. Response. Safety on the unit maintained through 15 minute checks.  Medications taken as prescribed. Remained calm through out shift.

## 2016-03-23 NOTE — Plan of Care (Signed)
Problem: Coping: Goal: Ability to verbalize feelings will improve Outcome: Not Progressing Patient continues to be very guarded in his responses to questions about his emotional health.

## 2016-03-24 NOTE — Tx Team (Signed)
Interdisciplinary Treatment and Diagnostic Plan Update  03/24/2016 Time of Session: 9:30AM Dustin Esparza MRN: [id number]  Principal Diagnosis: Major depressive disorder, single episode, severe without psychotic features ([place])  Secondary Diagnoses: Principal Problem:   Major depressive disorder, single episode, severe without psychotic features ([place])   Current Medications:  Current Facility-Administered Medications  Medication Dose Route Frequency Provider Last Rate Last Dose  . alum & mag hydroxide-simeth (MAALOX/MYLANTA) 200-200-20 MG/5ML suspension 30 mL  30 mL Oral Q4H PRN Patrecia Pour, NP      . hydrOXYzine (ATARAX/VISTARIL) tablet 25 mg  25 mg Oral Q6H PRN Jenne Campus, MD      . loperamide (IMODIUM) capsule 2-4 mg  2-4 mg Oral PRN Jenne Campus, MD      . LORazepam (ATIVAN) tablet 1 mg  1 mg Oral Q6H PRN Myer Peer Cobos, MD      . magnesium hydroxide (MILK OF MAGNESIA) suspension 30 mL  30 mL Oral Daily PRN Patrecia Pour, NP      . multivitamin with minerals tablet 1 tablet  1 tablet Oral Daily Nanci Pina, FNP   1 tablet at 03/23/16 1129  . nicotine polacrilex (NICORETTE) gum 2 mg  2 mg Oral PRN Nanci Pina, FNP   2 mg at 03/23/16 1128  . OLANZapine zydis (ZYPREXA) disintegrating tablet 5 mg  5 mg Oral Q8H PRN Nanci Pina, FNP      . ondansetron (ZOFRAN-ODT) disintegrating tablet 4 mg  4 mg Oral Q6H PRN Jenne Campus, MD      . thiamine (VITAMIN B-1) tablet 100 mg  100 mg Oral Daily Myer Peer Cobos, MD   100 mg at 03/23/16 1128  . traZODone (DESYREL) tablet 50 mg  50 mg Oral QHS,MR X 1 Nanci Pina, FNP       PTA Medications: No prescriptions prior to admission.    Patient Stressors: Financial difficulties Occupational concerns Substance abuse Other: Housing concerns  Patient Strengths: Active sense of humor Average or above average Architect for treatment/growth Physical Health  Treatment  Modalities: Medication Management, Group therapy, Case management,  1 to 1 session with clinician, Psychoeducation, Recreational therapy.   Physician Treatment Plan for Primary Diagnosis: Major depressive disorder, single episode, severe without psychotic features ([place]) Long Term Goal(s): Improvement in symptoms so as ready for discharge Improvement in symptoms so as ready for discharge   Short Term Goals: Ability to identify triggers associated with substance abuse/mental health issues will improve Ability to verbalize feelings will improve Ability to disclose and discuss suicidal ideas Ability to demonstrate self-control will improve Ability to identify and develop effective coping behaviors will improve  Medication Management: Evaluate patient's response, side effects, and tolerance of medication regimen.  Therapeutic Interventions: 1 to 1 sessions, Unit Group sessions and Medication administration.  Evaluation of Outcomes: Progressing  Physician Treatment Plan for Secondary Diagnosis: Principal Problem:   Major depressive disorder, single episode, severe without psychotic features ([place])  Long Term Goal(s): Improvement in symptoms so as ready for discharge Improvement in symptoms so as ready for discharge   Short Term Goals: Ability to identify triggers associated with substance abuse/mental health issues will improve Ability to verbalize feelings will improve Ability to disclose and discuss suicidal ideas Ability to demonstrate self-control will improve Ability to identify and develop effective coping behaviors will improve     Medication Management: Evaluate patient's response, side effects, and tolerance of medication regimen.  Therapeutic Interventions: 1 to  1 sessions, Unit Group sessions and Medication administration.  Evaluation of Outcomes: Progressing   RN Treatment Plan for Primary Diagnosis: Major depressive disorder, single episode, severe without psychotic  features ([place]) Long Term Goal(s): Knowledge of disease and therapeutic regimen to maintain health will improve  Short Term Goals: Ability to remain free from injury will improve, Ability to verbalize feelings will improve and Ability to disclose and discuss suicidal ideas  Medication Management: RN will administer medications as ordered by provider, will assess and evaluate patient's response and provide education to patient for prescribed medication. RN will report any adverse and/or side effects to prescribing provider.  Therapeutic Interventions: 1 on 1 counseling sessions, Psychoeducation, Medication administration, Evaluate responses to treatment, Monitor vital signs and CBGs as ordered, Perform/monitor CIWA, COWS, AIMS and Fall Risk screenings as ordered, Perform wound care treatments as ordered.  Evaluation of Outcomes: Progressing   LCSW Treatment Plan for Primary Diagnosis: Major depressive disorder, single episode, severe without psychotic features ([place]) Long Term Goal(s): Safe transition to appropriate next level of care at discharge, Engage patient in therapeutic group addressing interpersonal concerns.  Short Term Goals: Engage patient in aftercare planning with referrals and resources, Facilitate patient progression through stages of change regarding substance use diagnoses and concerns and Identify triggers associated with mental health/substance abuse issues  Therapeutic Interventions: Assess for all discharge needs, 1 to 1 time with Social worker, Explore available resources and support systems, Assess for adequacy in community support network, Educate family and significant other(s) on suicide prevention, Complete Psychosocial Assessment, Interpersonal group therapy.  Evaluation of Outcomes: Progressing   Progress in Treatment: Attending groups: Yes. Participating in groups: Yes. Taking medication as prescribed: Yes. Toleration medication: Yes. Family/Significant other  contact made: Pt declined family contact. SPE completed with patient.  Patient understands diagnosis: Yes. Discussing patient identified problems/goals with staff: Yes. Medical problems stabilized or resolved: Yes. Denies suicidal/homicidal ideation: Yes. Issues/concerns per patient self-inventory: No. Other: n/a  New problem(s) identified: No, Describe:  n/a  New Short Term/Long Term Goal(s): detox; medication stabilization; development of comprehensive mental wellness/sobriety plan.   Discharge Plan or Barriers:  Pt is interested in screening at [facility]. CSW assessing. Pt reports no current providers.   Reason for Continuation of Hospitalization: Depression Medication stabilization Withdrawal symptoms  Estimated Length of Stay: 3-5 days   Attendees: Patient: 03/24/2016 8:27 AM  Physician: Dr. Parke Poisson MD 03/24/2016 8:27 AM  Nursing: Jonni Sanger RN 03/24/2016 8:27 AM  RN Care Manager: Lars Pinks CM 03/24/2016 8:27 AM  Social Worker: Nira Conn Smart, LCSW; Adriana Reams LCSW 03/24/2016 8:27 AM  Recreational Therapist: Rhunette Croft 03/24/2016 8:27 AM  Other: Lindell Spar NP 03/24/2016 8:27 AM  Other:  03/24/2016 8:27 AM  Other: 03/24/2016 8:27 AM    Scribe for Treatment Team: [place], LCSW 03/24/2016 8:27 AM

## 2016-03-24 NOTE — Progress Notes (Signed)
D:Pt presents with flat affect and depressed mood. Pt appears withdrawn and isolates in his room. Pt forwards little information and appeared guarded during shift assessment. Pt denies suicdial thoughts, depression and anxiety. Pt refused morning meds. Pt do not appear to be invested in tx.  A: Orders reviewed with pt. Medications offered to pt. Verbal support provided. Pt encouraged to attend groups. 15 minute checks performed for safety.  R: Pt seeking to get into an [facility] and would like to speak to CSW.

## 2016-03-24 NOTE — Progress Notes (Signed)
D: Pt presents with depressed affect and mood.  He reports his goal is "to talk to [facility]."  Pt denies SI/HI, denies hallucinations, denies pain, denies withdrawal symptoms.  Pt has stayed in his room for the majority of the night.  A: Introduced self to pt.  Support and encouragement offered.  Medication offered per order.    R: Pt declines to take scheduled Trazodone.  He verbally contracts for safety.  Will continue to monitor and assess.

## 2016-03-24 NOTE — [facility] Group Notes (Signed)
[place] LCSW Group Therapy  03/24/2016 3:46 PM  Type of Therapy:  Group Therapy  Participation Level:  Did Not Attend-pt invited. Chose to remain in room.   Summary of Progress/Problems: Today's Topic: Overcoming Obstacles. Patients identified one short term goal and potential obstacles in reaching this goal. Patients processed barriers involved in overcoming these obstacles. Patients identified steps necessary for overcoming these obstacles and explored motivation (internal and external) for facing these difficulties head on.   Nalaysia Manganiello [name] LCSW 03/24/2016, 3:46 PM

## 2016-03-24 NOTE — Progress Notes (Signed)
Recreation Therapy Notes  Date: 03/24/16 Time: 0930 Location: 300 Hall Dayroom  Group Topic: Stress Management  Goal Area(s) Addresses:  Patient will verbalize importance of using healthy stress management.  Patient will identify positive emotions associated with healthy stress management.   Intervention: Stress Management  Activity :  Peaceful [place]aves Guided Imagery.  LRT introduced the stress management concept of guided imagery.  LRT read a script to allow patients the opportunity engage and participate in the activity.  Patients were to follow along as LRT read script to participate in the activity.  Education:  Stress Management, Discharge Planning.   Education Outcome: Acknowledges edcuation/In group clarification offered/Needs additional education  Clinical Observations/Feedback: Pt did not attend group.   Victorino Sparrow, LRT/CTRS         Victorino Sparrow A 03/24/2016 12:17 PM

## 2016-03-24 NOTE — Progress Notes (Signed)
[facility] MD Progress Note  03/24/2016 12:53 PM Dustin Esparza  MRN:  [id number]   Subjective:  I went to group and AA meeting. Didn't learn nothing I already didn't know.   Per nursing:Patient presents with flat affect and is irritable. Patient did attend AA group but is isolative to room during free time. When nurse attempts to engage with patient, patient is minimal and guarded. Patient currently in room and states "I am trying to sleep". Patient denies SI/HI/AVH or pain. Patient contracts for safety at this time. Patient refuses PM trazodone and declines need for any PRN medications.  Objective:  Patient was seen and chart reviewed. Patient stated that he is a 56 years old single Caucasian male admitted voluntarily  from [facility] department for depression, anxiety, substance abuse and suicidal thoughts.   During today's assessment, he continues to minimize symptoms at this time. He is not vested in treatment and spends a lot of his time in his room lying in bed. He does make mention that he would like to talk to [place], [place] ; and this is his primary goal. He wants to focus on placement and would like to go to an [place].  Pt denies any depressive or anxiety symptoms at this time. He continues to decline medication for depression and or anxiety at this time. Pt denies SI, HI, and AVH, but states that he has not improved in the last 24 hours. Does contract for safety.   Principal Problem: Major depressive disorder, single episode, severe without psychotic features ([place]) Diagnosis:   Patient Active Problem List   Diagnosis Date Noted  . Major depressive disorder, single episode, severe without psychotic features ([place]) [F32.2] 03/22/2016   Total Time spent with patient: 20 minutes  Past Psychiatric History:no prior psychiatric admissions, one suicide attempt in 2000 by cutting wrist, denies history of violence, denies history of psychosis, denies history of mania, describes history  of depression, which he feels is substance induced.   Substance Abuse History in the last 12 months:  History of cocaine dependence, history of alcohol dependence, but states " really I kind of switched over from alcohol to cocaine ". Denies history of opiate dependence, endorses occasional use of heroin. Remote IVDA in the 80's.   Consequences of Substance Abuse: Unemployment, homelessness, distant relationship with family.  Previous Psychotropic Medications: States he has never been on any psychiatric medications.   Past Medical History:  Past Medical History:  Diagnosis Date  . Bladder cancer ([place])   . Dysuria-frequency syndrome   . Hematuria   . History of drug use    Quit cocaine Oct 2015  . History of narcotic addiction ([place])   . History of seizure    due to reaction advil/tylenol gel capsule  . HOH (hard of hearing)    right ear  . 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 Wears glasses     Past Surgical History:  Procedure Laterality Date  . CYSTOSCOPY WITH URETHRAL DILATATION N/A 10/31/2014   Procedure: CYSTOSCOPY WITH URETHRAL DILATATION;  Surgeon: Festus Aloe, MD;  Location: [facility];  Service: Urology;  Laterality: N/A;  . TRANSURETHRAL RESECTION OF BLADDER TUMOR N/A 10/31/2014   Procedure: TRANSURETHRAL RESECTION OF BLADDER TUMOR (TURBT);  Surgeon: Festus Aloe, MD;  Location: [facility];  Service: Urology;  Laterality: N/A;  . TRANSURETHRAL RESECTION OF BLADDER TUMOR N/A 12/05/2014   Procedure: TRANSURETHRAL RESECTION OF BLADDER TUMOR (TURBT);  Surgeon: Rodman Key  Junious Silk, MD;  Location: [facility];  Service: Urology;  Laterality: N/A;   Family History:  Family History  Problem Relation Age of Onset  . Diabetes Mother   . Stroke Mother   . Diabetes Father   . Heart disease Father   . Heart disease Brother   . Heart disease Paternal Grandfather    Family Psychiatric  History: Denies Social History:   History  Alcohol Use  . Yes    Comment: Daily. Last drink: 3 hours ago     History  Drug Use  . Types: Cocaine    Comment: Herion, Last used: yesterday    Social History   Social History  . Marital status: Divorced    Spouse name: N/A  . Number of children: N/A  . Years of education: N/A   Social History Main Topics  . Smoking status: Current Every Day Smoker    Packs/day: 1.00    Years: 40.00    Types: Cigarettes  . Smokeless tobacco: Never Used  . Alcohol use Yes     Comment: Daily. Last drink: 3 hours ago  . Drug use:     Types: Cocaine     Comment: Herion, Last used: yesterday  . Sexual activity: Not Asked   Other Topics Concern  . None   Social History Narrative  . None   Additional Social History:      Sleep: Good  Appetite:  Fair  Current Medications: Current Facility-Administered Medications  Medication Dose Route Frequency Provider Last Rate Last Dose  . alum & mag hydroxide-simeth (MAALOX/MYLANTA) 200-200-20 MG/5ML suspension 30 mL  30 mL Oral Q4H PRN Patrecia Pour, NP      . hydrOXYzine (ATARAX/VISTARIL) tablet 25 mg  25 mg Oral Q6H PRN Jenne Campus, MD      . loperamide (IMODIUM) capsule 2-4 mg  2-4 mg Oral PRN Jenne Campus, MD      . LORazepam (ATIVAN) tablet 1 mg  1 mg Oral Q6H PRN Myer Peer Chigozie Basaldua, MD      . magnesium hydroxide (MILK OF MAGNESIA) suspension 30 mL  30 mL Oral Daily PRN Patrecia Pour, NP      . multivitamin with minerals tablet 1 tablet  1 tablet Oral Daily Nanci Pina, FNP   1 tablet at 03/23/16 1129  . nicotine polacrilex (NICORETTE) gum 2 mg  2 mg Oral PRN Nanci Pina, FNP   2 mg at 03/23/16 1128  . OLANZapine zydis (ZYPREXA) disintegrating tablet 5 mg  5 mg Oral Q8H PRN Nanci Pina, FNP      . ondansetron (ZOFRAN-ODT) disintegrating tablet 4 mg  4 mg Oral Q6H PRN Jenne Campus, MD      . thiamine (VITAMIN B-1) tablet 100 mg  100 mg Oral Daily Myer Peer Birdie Beveridge, MD   100 mg at 03/23/16 1128  .  traZODone (DESYREL) tablet 50 mg  50 mg Oral QHS,MR X 1 Nanci Pina, FNP        Lab Results:  No results found for this or any previous visit (from the past 48 hour(s)).  Blood Alcohol level:  Lab Results  Component Value Date   ETH 6 (H) [id number]    Metabolic Disorder Labs: No results found for: HGBA1C, MPG No results found for: PROLACTIN No results found for: CHOL, TRIG, HDL, CHOLHDL, VLDL, LDLCALC  Physical Findings: AIMS: Facial and Oral Movements Muscles of Facial Expression: None, normal Lips and Perioral Area: None,  normal Jaw: None, normal Tongue: None, normal,Extremity Movements Upper (arms, wrists, hands, fingers): None, normal Lower (legs, knees, ankles, toes): None, normal, Trunk Movements Neck, shoulders, hips: None, normal, Overall Severity Severity of abnormal movements (highest score from questions above): None, normal Incapacitation due to abnormal movements: None, normal Patient's awareness of abnormal movements (rate only patient's report): No Awareness, Dental Status Current problems with teeth and/or dentures?: No Does patient usually wear dentures?: No  CIWA:  CIWA-Ar Total: 0 COWS:     Musculoskeletal: Strength & Muscle Tone: within normal limits Gait & Station: normal Patient leans: N/A  Psychiatric Specialty Exam: Physical Exam   ROS   Blood pressure 121/80, pulse 73, temperature 98.4 F (36.9 C), temperature source Oral, resp. rate 16, height 5\' 9"  (1.753 m), weight 71.7 kg (158 lb), SpO2 97 %.Body mass index is 23.33 kg/m.  General Appearance: Fairly Groomed  Eye Contact:  Fair  Speech:  Normal Rate  Volume:  Normal  Mood:  Depressed  Affect:  Depressed and Restricted withdrawn  Thought Process:  Linear and Descriptions of Associations: Intact  Orientation:  Full (Time, Place, and Person)  Thought Content:  Logical  Suicidal Thoughts:  No  Homicidal Thoughts:  No  Memory:  Immediate;   Fair Recent;   Fair  Judgement:  Poor   Insight:  Lacking  Psychomotor Activity:  Decreased  Concentration:  Concentration: Fair and Attention Span: Poor  Recall:  [employer] of Knowledge:  Fair  Language:  Fair  Akathisia:  No  Handed:  Right  AIMS (if indicated):     Assets:  Communication Skills Desire for Improvement Financial Resources/Insurance Leisure Time Physical Health Social Support Talents/Skills Vocational/Educational  ADL's:  Intact  Cognition:  WNL  Sleep:  Number of Hours: 4.5     Treatment Plan Summary: Daily contact with patient to assess and evaluate symptoms and progress in treatment and Medication management   Plan: Plan reviewed today on 03/24/2016, no immediate changes at this time.  1. Patient was admitted to the Adult  unit at [facility] under the service of Dr. Parke Poisson. 2.  Routine labs, which include CBC, CMP, UDS, UA, and medical consultation were reviewed and routine PRN's were ordered for the patient. 3. Will maintain Q 15 minutes observation for safety.  Estimated LOS:  3-5 days 4. During this hospitalization the patient will receive psychosocial  Assessment. 5. Patient will participate in  group, milieu, and family therapy. Psychotherapy: Social and Proofreader training,substance abuse, learning based strategies, cognitive behavioral, and family object relations individuation separation intervention psychotherapies can be considered.  6. To reduce current symptoms to base line and improve the patient's overall level of functioning will adjust Medication management as follow: 7. Dustin Esparza was educated about medication efficacy and side effects. Dustin Esparza was educated about medication efficacy, risk factors for suicide, and treatment options. No psychotropic medications will be started at this time. Patient does endorsed acute psychiatric symptoms, however is against pharmacological management as he states his depression is substance induced. Patient  verbalizes not wanting to be on psychotropic medications since he the depression is substance induced. We'll continue to monitor patient's mood and behavior to further assess the need for psychotropic medication. At this point patient will be monitored for recurrence of suicidal ideation. 8. Will continue to monitor patient's mood and behavior. 9. Social Work will schedule a Family meeting to obtain collateral information and discuss discharge and follow up plan.  Discharge concerns will  also be addressed:  Safety, stabilization, and access to medication 10. Discharge planning in progress, will work with social work for discharge and placement.  Nanci Pina, FNP 03/24/2016, 12:53 PM   Agree with NP Progress Note

## 2016-03-25 NOTE — Progress Notes (Addendum)
[facility] MD Progress Note  03/25/2016 2:31 PM Dustin Esparza  MRN:  [id number]   Subjective:  Patient states he feels "OK", at this time minimizes depression. Denies medication side effects. Objective: I have discussed case with treatment team and have met with patient . Patient presents calm, in bed, vaguely irritable, superificial. At present he minimizes symptoms of depression, and states mood is "OK". He is future oriented, and reports interest in going to a rehab program at discharge , after which he intends to go into an [facility]. Staff reports patient tends to isolate in room with limited involvement in milieu. He states he dislikes being in groups and states he feels some peers are too talkative and " mouthy", so prefers to keep to self . Denies any suicidal ideations and as above, is future oriented. No psychotic symptoms. Patient does report feeling motivated in sobriety, and states he had significant period of sobriety prior to recent relapse .    Principal Problem: Major depressive disorder, single episode, severe without psychotic features ([place]) Diagnosis:   Patient Active Problem List   Diagnosis Date Noted  . Major depressive disorder, single episode, severe without psychotic features ([place]) [F32.2] 03/22/2016   Total Time spent with patient: 20 minutes  Previous Psychotropic Medications: States he has never been on any psychiatric medications.   Past Medical History:  Past Medical History:  Diagnosis Date  . Bladder cancer ([place])   . Dysuria-frequency syndrome   . Hematuria   . History of drug use    Quit cocaine Oct 2015  . History of narcotic addiction ([place])   . History of seizure    due to reaction advil/tylenol gel capsule  . HOH (hard of hearing)    right ear  . Recovering alcoholic [facility])    Quit Oct 2015  . Wears glasses     Past Surgical History:  Procedure Laterality Date  . CYSTOSCOPY WITH URETHRAL DILATATION N/A 10/31/2014   Procedure:  CYSTOSCOPY WITH URETHRAL DILATATION;  Surgeon: Festus Aloe, MD;  Location: [facility];  Service: Urology;  Laterality: N/A;  . TRANSURETHRAL RESECTION OF BLADDER TUMOR N/A 10/31/2014   Procedure: TRANSURETHRAL RESECTION OF BLADDER TUMOR (TURBT);  Surgeon: Festus Aloe, MD;  Location: [facility];  Service: Urology;  Laterality: N/A;  . TRANSURETHRAL RESECTION OF BLADDER TUMOR N/A 12/05/2014   Procedure: TRANSURETHRAL RESECTION OF BLADDER TUMOR (TURBT);  Surgeon: Festus Aloe, MD;  Location: [facility];  Service: Urology;  Laterality: N/A;   Family History:  Family History  Problem Relation Age of Onset  . Diabetes Mother   . Stroke Mother   . Diabetes Father   . Heart disease Father   . Heart disease Brother   . Heart disease Paternal Grandfather    Family Psychiatric  History: Denies Social History:  History  Alcohol Use  . Yes    Comment: Daily. Last drink: 3 hours ago     History  Drug Use  . Types: Cocaine    Comment: Herion, Last used: yesterday    Social History   Social History  . Marital status: Divorced    Spouse name: N/A  . Number of children: N/A  . Years of education: N/A   Social History Main Topics  . Smoking status: Current Every Day Smoker    Packs/day: 1.00    Years: 40.00    Types: Cigarettes  . Smokeless tobacco: Never Used  . Alcohol use Yes  Comment: Daily. Last drink: 3 hours ago  . Drug use:     Types: Cocaine     Comment: Herion, Last used: yesterday  . Sexual activity: Not Asked   Other Topics Concern  . None   Social History Narrative  . None   Additional Social History:      Sleep: Good  Appetite:  Good  Current Medications: Current Facility-Administered Medications  Medication Dose Route Frequency Provider Last Rate Last Dose  . alum & mag hydroxide-simeth (MAALOX/MYLANTA) 200-200-20 MG/5ML suspension 30 mL  30 mL Oral Q4H PRN Patrecia Pour, NP      .  hydrOXYzine (ATARAX/VISTARIL) tablet 25 mg  25 mg Oral Q6H PRN Jenne Campus, MD      . loperamide (IMODIUM) capsule 2-4 mg  2-4 mg Oral PRN Jenne Campus, MD      . LORazepam (ATIVAN) tablet 1 mg  1 mg Oral Q6H PRN Myer Peer Cobos, MD      . magnesium hydroxide (MILK OF MAGNESIA) suspension 30 mL  30 mL Oral Daily PRN Patrecia Pour, NP      . multivitamin with minerals tablet 1 tablet  1 tablet Oral Daily Nanci Pina, FNP   1 tablet at 03/23/16 1129  . nicotine polacrilex (NICORETTE) gum 2 mg  2 mg Oral PRN Nanci Pina, FNP   2 mg at 03/23/16 1128  . OLANZapine zydis (ZYPREXA) disintegrating tablet 5 mg  5 mg Oral Q8H PRN Nanci Pina, FNP      . ondansetron (ZOFRAN-ODT) disintegrating tablet 4 mg  4 mg Oral Q6H PRN Jenne Campus, MD      . thiamine (VITAMIN B-1) tablet 100 mg  100 mg Oral Daily Myer Peer Cobos, MD   100 mg at 03/23/16 1128  . traZODone (DESYREL) tablet 50 mg  50 mg Oral QHS,MR X 1 Nanci Pina, FNP        Lab Results:  No results found for this or any previous visit (from the past 48 hour(s)).  Blood Alcohol level:  Lab Results  Component Value Date   ETH 6 (H) 40/34/7425    Metabolic Disorder Labs: No results found for: HGBA1C, MPG No results found for: PROLACTIN No results found for: CHOL, TRIG, HDL, CHOLHDL, VLDL, LDLCALC  Physical Findings: AIMS: Facial and Oral Movements Muscles of Facial Expression: None, normal Lips and Perioral Area: None, normal Jaw: None, normal Tongue: None, normal,Extremity Movements Upper (arms, wrists, hands, fingers): None, normal Lower (legs, knees, ankles, toes): None, normal, Trunk Movements Neck, shoulders, hips: None, normal, Overall Severity Severity of abnormal movements (highest score from questions above): None, normal Incapacitation due to abnormal movements: None, normal Patient's awareness of abnormal movements (rate only patient's report): No Awareness, Dental Status Current problems with  teeth and/or dentures?: No Does patient usually wear dentures?: No  CIWA:  CIWA-Ar Total: 1 COWS:     Musculoskeletal: Strength & Muscle Tone: within normal limits Gait & Station: normal Patient leans: N/A  Psychiatric Specialty Exam: Physical Exam  ROS does not endorse chest pain , no shortness of breath, no vomiting   Blood pressure 118/81, pulse 75, temperature 98.3 F (36.8 C), temperature source Oral, resp. rate 16, height _0  (1.753 m), weight 71.7 kg (158 lb), SpO2 97 %.Body mass index is 23.33 kg/m.  General Appearance: improved grooming compared to admission  Eye Contact:  Fair  Speech:  Normal Rate- answers questions curtly , with mono syllables or short  phrases   Volume:  Normal  Mood:  Denies feeling depressed, presents irritable  Affect:  Vaguely irritable, restricted   Thought Process:  Linear and Descriptions of Associations: Intact  Orientation:  Full (Time, Place, and Person)  Thought Content:  linear  Suicidal Thoughts:  No denies any suicidal or self injurious ideations, denies homicidal ideations  Homicidal Thoughts:  No  Memory:   Recent and remote grossly intact   Judgement:  Fair  Insight:  Fair  Psychomotor Activity:  Decreased  Concentration:  Concentration: Fair and Attention Span: Fair  Recall:  Good  Fund of Knowledge:  Good  Language:  Fair  Akathisia:  No  Handed:  Right  AIMS (if indicated):     Assets:  Communication Skills Desire for Improvement Financial Resources/Insurance Leisure Time Physical Health Social Support Talents/Skills Vocational/Educational  ADL's:  Intact  Cognition:  WNL  Sleep:  Number of Hours: 5.5   Assessment - patient presents vaguely irritable, guarded, but at this time minimizes depression or neuro-vegetative symptoms . Denies any SI. He does not present invested in milieu, and acknowledges he dislikes socializing and going to groups, but does state he goes to meals and NA or AA meetings. He wants to go to a  rehab on discharge. He is not currently on any standing psychiatric medications and states he is not interested in any at this time.   Treatment Plan Summary: Daily contact with patient to assess and evaluate symptoms and progress in treatment and Medication management  Encourage group and milieu participation  Treatment team working on disposition planning, patient wants to go to a residential rehab program Trazodone 50 mgrs QHS PRN for insomnia Vistaril 25 mgrs Q 6 hours PRN for anxiety Check TSH Neita Garnet, MD 03/25/2016, 2:31 PM   Patient ID: [name], male   DOB: May 11, 1960, 56 y.o.   MRN: [id number]

## 2016-03-25 NOTE — BHH Group Notes (Signed)
[facility] LCSW Group Therapy  03/25/2016 4:14 PM  Type of Therapy:  Group Therapy  Participation Level:  Did Not Attend-pt invited.chose to rest in room.   Summary of Progress/Problems: [facility] Speaker came to talk about his personal journey with substance abuse and addiction. The pt processed ways by which to relate to the speaker. [place] speaker provided handouts and educational information pertaining to groups and services offered by the [facility].   Kamariah Fruchter [name] LCSW 03/25/2016, 4:14 PM

## 2016-03-25 NOTE — Progress Notes (Signed)
Recreation Therapy Notes  Animal-Assisted Activity (AAA) Program Checklist/Progress Notes Patient Eligibility Criteria Checklist & Daily Group note for Rec TxIntervention  Date: 01.16.2018 Time: 2:45pm Location: [address]    AAA/T Program Assumption of Risk Form signed by Patient/ or Parent Legal Guardian Yes  Patient is free of allergies or sever asthma Yes  Patient reports no fear of animals Yes  Patient reports no history of cruelty to animals Yes  Patient understands his/her participation is voluntary Yes  Behavioral Response: Did not attend.   Laureen Ochs Georgann Bramble, LRT/CTRS        Kiefer Opheim L 03/25/2016 3:05 PM

## 2016-03-25 NOTE — Progress Notes (Signed)
The patient attended the entire Wrap-Up group. He states that he had a better day today. As for the theme of the day, his wellness strategy will be to work on his recovery.

## 2016-03-25 NOTE — Progress Notes (Signed)
D: Patient has been in bed majority of morning.  He refuses medications and is not participating in group activities.  Checked on patient earlier and he stated, "I'm fine.  Nira Conn is working on something for me."  Patient refused to fill out self inventory.  Patient has irritable and informed nurse, "please close that door when you leave."  Patient was finished with conversation.  He denies any thoughts of self harm.  He does not appear to be vested in treatment. A: Continue to monitor medication management and MD orders.  Safety checks completed every 15 minutes per protocol.  Offer support and encouragement as needed.   R: Patient remains isolative and withdrawn.

## 2016-03-26 DIAGNOSIS — Z823 Family history of stroke: Secondary | ICD-10-CM

## 2016-03-26 DIAGNOSIS — Z8249 Family history of ischemic heart disease and other diseases of the circulatory system: Secondary | ICD-10-CM

## 2016-03-26 DIAGNOSIS — F17210 Nicotine dependence, cigarettes, uncomplicated: Secondary | ICD-10-CM

## 2016-03-26 DIAGNOSIS — Z833 Family history of diabetes mellitus: Secondary | ICD-10-CM

## 2016-03-26 DIAGNOSIS — F322 Major depressive disorder, single episode, severe without psychotic features: Principal | ICD-10-CM

## 2016-03-26 DIAGNOSIS — Z9889 Other specified postprocedural states: Secondary | ICD-10-CM

## 2016-03-26 DIAGNOSIS — Z79899 Other long term (current) drug therapy: Secondary | ICD-10-CM

## 2016-03-26 LAB — TSH: TSH: 3.136 u[IU]/mL (ref 0.350–4.500)

## 2016-03-26 NOTE — Plan of Care (Signed)
Problem: Medication: Goal: Compliance with prescribed medication regimen will improve Outcome: Progressing Pt was compliant with scheduled HS medication tonight.

## 2016-03-26 NOTE — Progress Notes (Signed)
[facility] MD Progress Note  03/26/2016 3:43 PM Dustin Esparza  MRN:  [id number]   Subjective: Dustin Esparza stays, "My mood is okay". Denies being depressed. Denies substance withdrawal symptoms."  Objective: I have discussed case with treatment team and have met with patient . Patient presents calm, in bed, vaguely irritable, superificial. At present he minimizes symptoms of depression, and states mood is "OK". He is future oriented, and reports interest in going to a rehab program at discharge , after which he intends to go into an [facility]. Staff reports patient tends to isolate in room with limited involvement in milieu. He states he dislikes being in groups and states he feels some peers are too talkative and " mouthy", so prefers to keep to self . Denies any suicidal ideations and as above, is future oriented. No psychotic symptoms. Patient does report feeling motivated in sobriety, and states he had significant period of sobriety prior to recent relapse .  Principal Problem: Major depressive disorder, single episode, severe without psychotic features ([place]) Diagnosis:   Patient Active Problem List   Diagnosis Date Noted  . Major depressive disorder, single episode, severe without psychotic features ([place]) [F32.2] 03/22/2016   Total Time spent with patient: 15 minutes  Previous Psychotropic Medications: States he has never been on any psychiatric medications.   Past Medical History:  Past Medical History:  Diagnosis Date  . Bladder cancer ([place])   . Dysuria-frequency syndrome   . Hematuria   . History of drug use    Quit cocaine Oct 2015  . History of narcotic addiction ([place])   . History of seizure    due to reaction advil/tylenol gel capsule  . HOH (hard of hearing)    right ear  . Recovering alcoholic [facility])    Quit Oct 2015  . Wears glasses     Past Surgical History:  Procedure Laterality Date  . CYSTOSCOPY WITH URETHRAL DILATATION N/A 10/31/2014   Procedure: CYSTOSCOPY  WITH URETHRAL DILATATION;  Surgeon: Festus Aloe, MD;  Location: [facility];  Service: Urology;  Laterality: N/A;  . TRANSURETHRAL RESECTION OF BLADDER TUMOR N/A 10/31/2014   Procedure: TRANSURETHRAL RESECTION OF BLADDER TUMOR (TURBT);  Surgeon: Festus Aloe, MD;  Location: [facility];  Service: Urology;  Laterality: N/A;  . TRANSURETHRAL RESECTION OF BLADDER TUMOR N/A 12/05/2014   Procedure: TRANSURETHRAL RESECTION OF BLADDER TUMOR (TURBT);  Surgeon: Festus Aloe, MD;  [facility];  Service: Urology;  Laterality: N/A;   Family History:  Family History  Problem Relation Age of Onset  . Diabetes Mother   . Stroke Mother   . Diabetes Father   . Heart disease Father   . Heart disease Brother   . Heart disease Paternal Grandfather    Family Psychiatric  History: Denies  Social History:  History  Alcohol Use  . Yes    Comment: Daily. Last drink: 3 hours ago     History  Drug Use  . Types: Cocaine    Comment: Herion, Last used: yesterday    Social History   Social History  . Marital status: Divorced    Spouse name: N/A  . Number of children: N/A  . Years of education: N/A   Social History Main Topics  . Smoking status: Current Every Day Smoker    Packs/day: 1.00    Years: 40.00    Types: Cigarettes  . Smokeless tobacco: Never Used  . Alcohol use Yes     Comment: Daily.  Last drink: 3 hours ago  . Drug use:     Types: Cocaine     Comment: Herion, Last used: yesterday  . Sexual activity: Not Asked   Other Topics Concern  . None   Social History Narrative  . None   Additional Social History:  Sleep: Good  Appetite:  Good  Current Medications: Current Facility-Administered Medications  Medication Dose Route Frequency Provider Last Rate Last Dose  . alum & mag hydroxide-simeth (MAALOX/MYLANTA) 200-200-20 MG/5ML suspension 30 mL  30 mL Oral Q4H PRN Patrecia Pour, NP      . magnesium hydroxide (MILK  OF MAGNESIA) suspension 30 mL  30 mL Oral Daily PRN Patrecia Pour, NP      . multivitamin with minerals tablet 1 tablet  1 tablet Oral Daily Nanci Pina, FNP   1 tablet at 03/23/16 1129  . nicotine polacrilex (NICORETTE) gum 2 mg  2 mg Oral PRN Nanci Pina, FNP   2 mg at 03/23/16 1128  . OLANZapine zydis (ZYPREXA) disintegrating tablet 5 mg  5 mg Oral Q8H PRN Nanci Pina, FNP      . thiamine (VITAMIN B-1) tablet 100 mg  100 mg Oral Daily Jenne Campus, MD   100 mg at 03/23/16 1128  . traZODone (DESYREL) tablet 50 mg  50 mg Oral QHS,MR X 1 Nanci Pina, FNP       Lab Results:  Results for orders placed or performed during the hospital encounter of 03/22/16 (from the past 48 hour(s))  TSH     Status: None   Collection Time: 03/26/16  6:14 AM  Result Value Ref Range   TSH 3.136 0.350 - 4.500 uIU/mL    Comment: Performed by a 3rd Generation assay with a functional sensitivity of <=0.01 uIU/mL. Performed at [facility], [place] [address], [place], [place] [address]    Blood Alcohol level:  Lab Results  Component Value Date   ETH 6 (H) 87/56/4332   Metabolic Disorder Labs: No results found for: HGBA1C, MPG No results found for: PROLACTIN No results found for: CHOL, TRIG, HDL, CHOLHDL, VLDL, LDLCALC  Physical Findings: AIMS: Facial and Oral Movements Muscles of Facial Expression: None, normal Lips and Perioral Area: None, normal Jaw: None, normal Tongue: None, normal,Extremity Movements Upper (arms, wrists, hands, fingers): None, normal Lower (legs, knees, ankles, toes): None, normal, Trunk Movements Neck, shoulders, hips: None, normal, Overall Severity Severity of abnormal movements (highest score from questions above): None, normal Incapacitation due to abnormal movements: None, normal Patient's awareness of abnormal movements (rate only patient's report): No Awareness, Dental Status Current problems with teeth and/or dentures?: No Does patient  usually wear dentures?: No  CIWA:  CIWA-Ar Total: 2 COWS:     Musculoskeletal: Strength & Muscle Tone: within normal limits Gait & Station: normal Patient leans: N/A  Psychiatric Specialty Exam: Physical Exam  ROS does not endorse chest pain , no shortness of breath, no vomiting   Blood pressure 115/80, pulse 88, temperature 98.2 F (36.8 C), temperature source Oral, resp. rate 16, height '5\' 9"'  (1.753 m), weight 71.7 kg (158 lb), SpO2 97 %.Body mass index is 23.33 kg/m.  General Appearance: improved grooming compared to admission  Eye Contact:  Fair  Speech:  Normal Rate- answers questions curtly , with mono syllables or short phrases   Volume:  Normal  Mood:  Denies feeling depressed, presents irritable  Affect:  Vaguely irritable, restricted   Thought Process:  Linear and Descriptions  of Associations: Intact  Orientation:  Full (Time, Place, and Person)  Thought Content:  linear  Suicidal Thoughts:  No denies any suicidal or self injurious ideations, denies homicidal ideations  Homicidal Thoughts:  No  Memory:   Recent and remote grossly intact   Judgement:  Fair  Insight:  Fair  Psychomotor Activity:  Decreased  Concentration:  Concentration: Fair and Attention Span: Fair  Recall:  Good  Fund of Knowledge:  Good  Language:  Fair  Akathisia:  No  Handed:  Right  AIMS (if indicated):     Assets:  Communication Skills Desire for Improvement Financial Resources/Insurance Leisure Time Physical Health Social Support Talents/Skills Vocational/Educational  ADL's:  Intact  Cognition:  WNL  Sleep:  Number of Hours: 5.75   Assessment - patient presents vaguely irritable, guarded, but at this time minimizes depression or neuro-vegetative symptoms . Denies any SI. He does not present invested in milieu, and acknowledges he dislikes socializing and going to groups, but does state he goes to meals and NA or AA meetings. He wants to go to a rehab on discharge. He is not currently  on any standing psychiatric medications and states he is not interested in any at this time.   Treatment Plan Summary: Daily contact with patient to assess and evaluate symptoms and progress in treatment and Medication management  Encourage group and milieu participation  Treatment team working on disposition planning, patient wants to go to a residential rehab program Trazodone 50 mgrs QHS PRN for insomnia Vistaril 25 mgrs Q 6 hours PRN for anxiety Check TSH, result reviewed, within norm.  Encarnacion Slates, NP, PMHNP, FNP-BC 03/26/2016, 3:43 PM  Patient ID: Dustin Esparza, male   DOB: [birth year], 56 y.o.   MRN: [id number] Agree with NP Progress Note

## 2016-03-26 NOTE — Progress Notes (Signed)
D: Pt presents with depressed affect and mood.  When asked about his day he states he "enjoyed the circus."  Denies SI/HI, denies hallucinations, denies pain.  Pt is more visible in milieu tonight and he attended evening group.  Interacted with peers and staff appropriately.   A: Introduced self to pt.  Support and encouragement offered.  Medication offered per order.   R: Pt refuses medication.  He verbally contracts for safety.  Will continue to monitor and assess.

## 2016-03-26 NOTE — B[facility] Group Notes (Signed)
[facility] LCSW Aftercare Discharge Planning Group Note   03/26/2016 11:18 AM  Participation Quality:  Invited. DID NOT ATTEND. Pt continues to sleep in room; refuse to attend groups. Isolates in room.   Agastya Meister [name] LCSW

## 2016-03-26 NOTE — Progress Notes (Signed)
D: Patient remains irritable and maintains that he is "fine."  Patient is interested in pursing long term treatment upon discharge.  He has refused all medications here. Patient is observed in day room.  He does not interact with staff or his peers, electing to isolate to his room most of the time.  Patient states he doesn't like to be around his peers and doesn't like attending group.  He denies any withdrawal symptoms or thoughts of self harm. A: Continue to monitor medication management and MD orders.  Safety checks completed every 15 minutes per protocol.  Offer support and encouragement as needed. R: Patient remains withdrawn and isolative.

## 2016-03-26 NOTE — Progress Notes (Signed)
D: Pt presents with depressed affect and mood.  His goal is to "sleep well."  Denies SI/HI, denies hallucinations, denies pain.  He has been visible in milieu and has interacted appropriately with staff and peers.   A: Met with pt and offered support and encouragement.  Medication administered per order.  Medication education provided.  R: Pt is compliant with medication tonight.  He verbally contracts for safety.  Will continue to monitor and assess.

## 2016-03-27 NOTE — Progress Notes (Signed)
D: Patient continues to have irritable mood.  He denies any thoughts of self harm and continues to refuse medications.  Patient expressed interest in long term treatment.  He states he does not like to "be around others."  Patient somehow got into consult room, turned out the lights and fell asleep.  Staff found him during the 15 minute checks and informed him he could not stay in the room by himself.  Patient become irritable with staff.  He was educated on the rules and returned to his room.   A: Continue to monitor medication management and MD orders.  Safety checks completed every 15 minutes per protocol.  Offer support and encouragement as needed. R: Patient remains withdrawn and isolative.

## 2016-03-27 NOTE — Progress Notes (Signed)
D: Patient seen on day room. No interaction. Presents  with depressed and irritable mood. Refused to go to [place] stated "I don't like noise. I try to avoid it". Refused to answer any questions by this writer stated "same questions every night".  A: Staff encouraged patient to participate with the treatment plans and verbalize needs to staff. Due meds given as ordered. Routine safety checks maintained for safety. Will continue to monitor patient. R: Patient remains safe.

## 2016-03-27 NOTE — BHH Group Notes (Signed)
[facility] LCSW Group Therapy  03/27/2016 2:47 PM  Type of Therapy:  Group Therapy  Participation Level:  Did Not Attend  Finding Balance in Life. Today's group focused on defining balance in one's own words, identifying things that can knock one off balance, and exploring healthy ways to maintain balance in life. Group members were asked to provide an example of a time when they felt off balance, describe how they handled that situation, and process healthier ways to regain balance in the future. Group members were asked to share the most important tool for maintaining balance that they learned while at [facility] and how they plan to apply this method after discharge.   Beverely Pace 03/27/2016, 2:47 PM

## 2016-03-27 NOTE — Progress Notes (Signed)
[facility] MD Progress Note  03/27/2016 1:08 PM Dustin Esparza  MRN:  [id number]   Subjective: Patient reports " I am okay, I am just waiting for Heather to get me a appointment with [facility]."   Objective:  Dustin Esparza is awake, alert and oriented *3. Seen resting in dayroom, interacting with peers. Denies suicidal or homicidal ideation. Denies auditory or visual hallucination and does not appear to be responding to internal stimuli. Patient reports he is her for depression mainly,patient affect is flat and guarded. Patient is short with responses. Patient denies taking medications on a daily basis. Reports he has attended group sessions. Reports good appetite and states resting well. Support, encouragement and reassurance was provided.   Principal Problem: Major depressive disorder, single episode, severe without psychotic features ([place]) Diagnosis:   Patient Active Problem List   Diagnosis Date Noted  . Major depressive disorder, single episode, severe without psychotic features ([place]) [F32.2] 03/22/2016   Total Time spent with patient: 15 minutes  Previous Psychotropic Medications: States he has never been on any psychiatric medications.   Past Medical History:  Past Medical History:  Diagnosis Date  . Bladder cancer ([place])   . Dysuria-frequency syndrome   . Hematuria   . History of drug use    Quit cocaine Oct 2015  . History of narcotic addiction ([place])   . History of seizure    due to reaction advil/tylenol gel capsule  . HOH (hard of hearing)    right ear  . Recovering alcoholic [facility])    Quit Oct 2015  . Wears glasses     Past Surgical History:  Procedure Laterality Date  . CYSTOSCOPY WITH URETHRAL DILATATION N/A 10/31/2014   Procedure: CYSTOSCOPY WITH URETHRAL DILATATION;  Surgeon: Festus Aloe, MD;  Location: [facility];  Service: Urology;  Laterality: N/A;  . TRANSURETHRAL RESECTION OF BLADDER TUMOR N/A 10/31/2014   Procedure: TRANSURETHRAL  RESECTION OF BLADDER TUMOR (TURBT);  Surgeon: Festus Aloe, MD;  Location: [facility];  Service: Urology;  Laterality: N/A;  . TRANSURETHRAL RESECTION OF BLADDER TUMOR N/A 12/05/2014   Procedure: TRANSURETHRAL RESECTION OF BLADDER TUMOR (TURBT);  Surgeon: Festus Aloe, MD;  Location: [facility];  Service: Urology;  Laterality: N/A;   Family History:  Family History  Problem Relation Age of Onset  . Diabetes Mother   . Stroke Mother   . Diabetes Father   . Heart disease Father   . Heart disease Brother   . Heart disease Paternal Grandfather    Family Psychiatric  History: Denies  Social History:  History  Alcohol Use  . Yes    Comment: Daily. Last drink: 3 hours ago     History  Drug Use  . Types: Cocaine    Comment: Herion, Last used: yesterday    Social History   Social History  . Marital status: Divorced    Spouse name: N/A  . Number of children: N/A  . Years of education: N/A   Social History Main Topics  . Smoking status: Current Every Day Smoker    Packs/day: 1.00    Years: 40.00    Types: Cigarettes  . Smokeless tobacco: Never Used  . Alcohol use Yes     Comment: Daily. Last drink: 3 hours ago  . Drug use: Yes    Types: Cocaine     Comment: Herion, Last used: yesterday  . Sexual activity: Not Asked   Other Topics Concern  . None   Social  History Narrative  . None   Additional Social History:  Sleep: Good  Appetite:  Good  Current Medications: Current Facility-Administered Medications  Medication Dose Route Frequency Provider Last Rate Last Dose  . alum & mag hydroxide-simeth (MAALOX/MYLANTA) 200-200-20 MG/5ML suspension 30 mL  30 mL Oral Q4H PRN Patrecia Pour, NP      . magnesium hydroxide (MILK OF MAGNESIA) suspension 30 mL  30 mL Oral Daily PRN Patrecia Pour, NP      . multivitamin with minerals tablet 1 tablet  1 tablet Oral Daily Nanci Pina, FNP   1 tablet at 03/23/16 1129  . nicotine polacrilex  (NICORETTE) gum 2 mg  2 mg Oral PRN Nanci Pina, FNP   2 mg at 03/23/16 1128  . OLANZapine zydis (ZYPREXA) disintegrating tablet 5 mg  5 mg Oral Q8H PRN Nanci Pina, FNP      . thiamine (VITAMIN B-1) tablet 100 mg  100 mg Oral Daily Jenne Campus, MD   100 mg at 03/23/16 1128  . traZODone (DESYREL) tablet 50 mg  50 mg Oral QHS,MR X 1 Nanci Pina, FNP   50 mg at 03/26/16 2108   Lab Results:  Results for orders placed or performed during the hospital encounter of 03/22/16 (from the past 48 hour(s))  TSH     Status: None   Collection Time: 03/26/16  6:14 AM  Result Value Ref Range   TSH 3.136 0.350 - 4.500 uIU/mL    Comment: Performed by a 3rd Generation assay with a functional sensitivity of <=0.01 uIU/mL. Performed at [facility], [place] [address], [place], [place] [address]    Blood Alcohol level:  Lab Results  Component Value Date   ETH 6 (H) [id number]   Metabolic Disorder Labs: No results found for: HGBA1C, MPG No results found for: PROLACTIN No results found for: CHOL, TRIG, HDL, CHOLHDL, VLDL, LDLCALC  Physical Findings: AIMS: Facial and Oral Movements Muscles of Facial Expression: None, normal Lips and Perioral Area: None, normal Jaw: None, normal Tongue: None, normal,Extremity Movements Upper (arms, wrists, hands, fingers): None, normal Lower (legs, knees, ankles, toes): None, normal, Trunk Movements Neck, shoulders, hips: None, normal, Overall Severity Severity of abnormal movements (highest score from questions above): None, normal Incapacitation due to abnormal movements: None, normal Patient's awareness of abnormal movements (rate only patient's report): No Awareness, Dental Status Current problems with teeth and/or dentures?: No Does patient usually wear dentures?: No  CIWA:  CIWA-Ar Total: 2 COWS:     Musculoskeletal: Strength & Muscle Tone: within normal limits Gait & Station: normal Patient leans: N/A  Psychiatric  Specialty Exam: Physical Exam  Vitals reviewed. Constitutional: He is oriented to person, place, and time. He appears well-developed.  Cardiovascular: Normal rate.   Neurological: He is alert and oriented to person, place, and time.  Psychiatric: He has a normal mood and affect. His behavior is normal.    Review of Systems  Psychiatric/Behavioral: Positive for depression. The patient is nervous/anxious.    does not endorse chest pain , no shortness of breath, no vomiting   Blood pressure (!) 112/92, pulse 96, temperature 98.1 F (36.7 C), temperature source Oral, resp. rate 20, height 5\' 9"  (1.753 m), weight 71.7 kg (158 lb), SpO2 97 %.Body mass index is 23.33 kg/m.  General Appearance: Casual and Guarded  Eye Contact:  Fair  Speech:  Normal Rate   Volume:  Normal  Mood:  Denies feeling depressed, presents irritable  Affect:  Vaguely irritable, restricted   Thought Process:  Linear and Descriptions of Associations: Intact  Orientation:  Full (Time, Place, and Person)  Thought Content:  linear  Suicidal Thoughts:  No   Homicidal Thoughts:  No  Memory:   Recent and remote grossly intact   Judgement:  Fair  Insight:  Fair  Psychomotor Activity:  Decreased  Concentration:  Concentration: Fair and Attention Span: Fair  Recall:  Good  Fund of Knowledge:  Good  Language:  Fair  Akathisia:  No  Handed:  Right  AIMS (if indicated):     Assets:  Communication Skills Desire for Improvement Financial Resources/Insurance Leisure Time Physical Health Social Support Talents/Skills Vocational/Educational  ADL's:  Intact  Cognition:  WNL  Sleep:  Number of Hours: 6.75     I agree with current treatment plan on 03/27/2016, Patient seen face-to-face for psychiatric evaluation follow-up, chart reviewed. Reviewed the information documented and agree with the treatment plan.   Treatment Plan Summary: Daily contact with patient to assess and evaluate symptoms and progress in treatment  and Medication management  Encourage group and milieu participation  Treatment team working on disposition planning, patient wants to go to a residential rehab program  Trazodone 50 mgrs QHS PRN for insomnia Vistaril 25 mgrs Q 6 hours PRN for anxiety Check TSH, result reviewed, within norm.  Derrill Center, NP 03/27/2016, 1:08 PM  Agree with NP Progress Note

## 2016-03-28 MED ORDER — HYDROXYZINE HCL 50 MG PO TABS
50.0000 mg | ORAL_TABLET | Freq: Once | ORAL | Status: AC
  Administered 2016-03-28: 50 mg via ORAL
  Filled 2016-03-28 (×2): qty 1

## 2016-03-28 NOTE — Plan of Care (Signed)
Problem: Medication: Goal: Compliance with prescribed medication regimen will improve Outcome: Not Progressing Patient refused his morning medications.

## 2016-03-28 NOTE — [facility] Group Notes (Signed)
[place] LCSW Group Therapy  03/28/2016 2:46 PM  Type of Therapy:  Group Therapy  Participation Level:  Active  Participation Quality:  Attentive  Affect:  Appropriate  Cognitive:  Appropriate  Insight:  Engaged  Engagement in Therapy:  Engaged  Modes of Intervention:  Confrontation, Discussion, Education, Problem-solving, Socialization and Support  Summary of Progress/Problems: Feelings around Relapse. Group members discussed the meaning of relapse and shared personal stories of relapse, how it affected them and others, and how they perceived themselves during this time. Group members were encouraged to identify triggers, warning signs and coping skills used when facing the possibility of relapse. Social supports were discussed and explored in detail. Post Acute Withdrawal Syndrome (handout provided) was introduced and examined. Pt's were encouraged to ask questions, talk about key points associated with PAWS, and process this information in terms of relapse prevention.   Brittnei Jagiello [name] LCSW 03/28/2016, 2:46 PM

## 2016-03-28 NOTE — Progress Notes (Signed)
Data. Patient denies SI/HI/AVH. Patient refusing to get up and interact with peers or staff, except for meals for first half of shift. Did enter nto the milieu, in the common room, after lunch. Continued to have minimal interaction with peers, however. Refused his morning medications. Affect remains flat and his mood irritable. He responds minimally to questions about his mental/emotional health. On his self assessment he reports 5/10 for depression and anxiety and 4/10 for hopelessness. His goal for today is:"Treatment facility placement." Action. Emotional support and encouragement offered. Education provided on medication, indications and side effect. Q 15 minute checks done for safety. Response. Safety on the unit maintained through 15 minute checks.  Medications taken as prescribed. Remained calm and appropriate through out shift.

## 2016-03-28 NOTE — Tx Team (Signed)
Interdisciplinary Treatment and Diagnostic Plan Update  03/28/2016 Time of Session: 9:30AM Dustin Esparza MRN: [id number]  Principal Diagnosis: Major depressive disorder, single episode, severe without psychotic features ([place])  Secondary Diagnoses: Principal Problem:   Major depressive disorder, single episode, severe without psychotic features ([place])   Current Medications:  Current Facility-Administered Medications  Medication Dose Route Frequency Provider Last Rate Last Dose  . alum & mag hydroxide-simeth (MAALOX/MYLANTA) 200-200-20 MG/5ML suspension 30 mL  30 mL Oral Q4H PRN Patrecia Pour, NP      . magnesium hydroxide (MILK OF MAGNESIA) suspension 30 mL  30 mL Oral Daily PRN Patrecia Pour, NP      . multivitamin with minerals tablet 1 tablet  1 tablet Oral Daily Nanci Pina, FNP   1 tablet at 03/23/16 1129  . nicotine polacrilex (NICORETTE) gum 2 mg  2 mg Oral PRN Nanci Pina, FNP   2 mg at 03/23/16 1128  . OLANZapine zydis (ZYPREXA) disintegrating tablet 5 mg  5 mg Oral Q8H PRN Nanci Pina, FNP      . thiamine (VITAMIN B-1) tablet 100 mg  100 mg Oral Daily Jenne Campus, MD   100 mg at 03/23/16 1128  . traZODone (DESYREL) tablet 50 mg  50 mg Oral QHS,MR X 1 Nanci Pina, FNP   50 mg at 03/27/16 2302   PTA Medications: No prescriptions prior to admission.    Patient Stressors: Financial difficulties Occupational concerns Substance abuse Other: Housing concerns  Patient Strengths: Active sense of humor Average or above average Architect for treatment/growth Physical Health  Treatment Modalities: Medication Management, Group therapy, Case management,  1 to 1 session with clinician, Psychoeducation, Recreational therapy.   Physician Treatment Plan for Primary Diagnosis: Major depressive disorder, single episode, severe without psychotic features ([place]) Long Term Goal(s): Improvement in symptoms so as ready for  discharge Improvement in symptoms so as ready for discharge   Short Term Goals: Ability to identify triggers associated with substance abuse/mental health issues will improve Ability to verbalize feelings will improve Ability to disclose and discuss suicidal ideas Ability to demonstrate self-control will improve Ability to identify and develop effective coping behaviors will improve  Medication Management: Evaluate patient's response, side effects, and tolerance of medication regimen.  Therapeutic Interventions: 1 to 1 sessions, Unit Group sessions and Medication administration.  Evaluation of Outcomes: Progressing  Physician Treatment Plan for Secondary Diagnosis: Principal Problem:   Major depressive disorder, single episode, severe without psychotic features ([place])  Long Term Goal(s): Improvement in symptoms so as ready for discharge Improvement in symptoms so as ready for discharge   Short Term Goals: Ability to identify triggers associated with substance abuse/mental health issues will improve Ability to verbalize feelings will improve Ability to disclose and discuss suicidal ideas Ability to demonstrate self-control will improve Ability to identify and develop effective coping behaviors will improve     Medication Management: Evaluate patient's response, side effects, and tolerance of medication regimen.  Therapeutic Interventions: 1 to 1 sessions, Unit Group sessions and Medication administration.  Evaluation of Outcomes: Progressing   RN Treatment Plan for Primary Diagnosis: Major depressive disorder, single episode, severe without psychotic features ([place]) Long Term Goal(s): Knowledge of disease and therapeutic regimen to maintain health will improve  Short Term Goals: Ability to remain free from injury will improve, Ability to verbalize feelings will improve and Ability to disclose and discuss suicidal ideas  Medication Management: RN will administer medications as  ordered  by provider, will assess and evaluate patient's response and provide education to patient for prescribed medication. RN will report any adverse and/or side effects to prescribing provider.  Therapeutic Interventions: 1 on 1 counseling sessions, Psychoeducation, Medication administration, Evaluate responses to treatment, Monitor vital signs and CBGs as ordered, Perform/monitor CIWA, COWS, AIMS and Fall Risk screenings as ordered, Perform wound care treatments as ordered.  Evaluation of Outcomes: Progressing   LCSW Treatment Plan for Primary Diagnosis: Major depressive disorder, single episode, severe without psychotic features ([place]) Long Term Goal(s): Safe transition to appropriate next level of care at discharge, Engage patient in therapeutic group addressing interpersonal concerns.  Short Term Goals: Engage patient in aftercare planning with referrals and resources, Facilitate patient progression through stages of change regarding substance use diagnoses and concerns and Identify triggers associated with mental health/substance abuse issues  Therapeutic Interventions: Assess for all discharge needs, 1 to 1 time with Social worker, Explore available resources and support systems, Assess for adequacy in community support network, Educate family and significant other(s) on suicide prevention, Complete Psychosocial Assessment, Interpersonal group therapy.  Evaluation of Outcomes: not met/ not progressing    Progress in Treatment: Attending groups: No--pt refuses "I don't like noise." continues to isolate in room with minimal staff or peer interaction.  Participating in groups: No/not applicable  Taking medication as prescribed: Yes. Toleration medication: Pt is continuing to refuse medications  Family/Significant other contact made: Pt declined family contact. SPE completed with patient.  Patient understands diagnosis: Yes. Discussing patient identified problems/goals with staff: No  "it's the same questions every day."  Medical problems stabilized or resolved: Yes. Denies suicidal/homicidal ideation: Yes. [per self report  Issues/concerns per patient self-inventory: No. Other: n/a  New problem(s) identified: pt appears minimally vested in treatment; refusing to take medications or attend groups. MD and social worker have spoken to patient about this and encouraged him to attend groups and engage in treatment.   New Short Term/Long Term Goal(s): detox; medication stabilization; development of comprehensive mental wellness/sobriety plan.   Discharge Plan or Barriers:  Pt is interested in screening at [facility] and has been referred to [facility]. Pt is minimally vested in treatment. [facility] screening scheduled for: Tuesday 1/23 at 7:45AM.   Reason for Continuation of Hospitalization: Depression Medication stabilization  Estimated Length of Stay: 2-3 days  Attendees: Patient: 03/28/2016 10:08 AM  Physician: Dr. Gilberto Better MD 03/28/2016 10:08 AM  Nursing: Kieth Brightly RN; Rise Paganini RN 03/28/2016 10:08 AM  RN Care Manager: x 03/28/2016 10:08 AM  Social Worker: Nira Conn Smart, LCSW; Adriana Reams LCSW 03/28/2016 10:08 AM  Recreational Therapist: Rhunette Croft 03/28/2016 10:08 AM  Other: Lindell Spar NP; May Augustin NP 03/28/2016 10:08 AM  Other:  03/28/2016 10:08 AM  Other: 03/28/2016 10:08 AM    Scribe for Treatment Team: [place], LCSW 03/28/2016 10:08 AM

## 2016-03-28 NOTE — Progress Notes (Signed)
[facility] MD Progress Note  03/28/2016 4:38 PM Dustin Esparza  MRN:  [id number]   Subjective: Dustin Esparza reports " I am okay". He currently denies any new issues or concerns.  Objective:  Dustin Esparza is awake, alert and oriented X 3. Seen resting in his room. He interacts with peers when out of his room & in the day room. Denies suicidal or homicidal ideation. Denies auditory or visual hallucination and does not appear to be responding to internal stimuli. Patient reports he is her for depression mainly,patient affect is flat and guarded. Patient is short with responses. Patient denies taking medications on a daily basis. Reports he has attended group sessions. Reports good appetite and states resting well. Support, encouragement and reassurance was provided.   Principal Problem: Major depressive disorder, single episode, severe without psychotic features ([place]) Diagnosis:   Patient Active Problem List   Diagnosis Date Noted  . Major depressive disorder, single episode, severe without psychotic features ([place]) [F32.2] 03/22/2016   Total Time spent with patient: 15 minutes  Previous Psychotropic Medications: States he has never been on any psychiatric medications.   Past Medical History:  Past Medical History:  Diagnosis Date  . Bladder cancer ([place])   . Dysuria-frequency syndrome   . Hematuria   . History of drug use    Quit cocaine Oct 2015  . History of narcotic addiction ([place])   . History of seizure    due to reaction advil/tylenol gel capsule  . HOH (hard of hearing)    right ear  . Recovering alcoholic [facility])    Quit Oct 2015  . Wears glasses     Past Surgical History:  Procedure Laterality Date  . CYSTOSCOPY WITH URETHRAL DILATATION N/A 10/31/2014   Procedure: CYSTOSCOPY WITH URETHRAL DILATATION;  Surgeon: Festus Aloe, MD;  Location: [facility];  Service: Urology;  Laterality: N/A;  . TRANSURETHRAL RESECTION OF BLADDER TUMOR N/A 10/31/2014   Procedure:  TRANSURETHRAL RESECTION OF BLADDER TUMOR (TURBT);  Surgeon: Festus Aloe, MD;  Location: [facility];  Service: Urology;  Laterality: N/A;  . TRANSURETHRAL RESECTION OF BLADDER TUMOR N/A 12/05/2014   Procedure: TRANSURETHRAL RESECTION OF BLADDER TUMOR (TURBT);  Surgeon: Festus Aloe, MD;  Location: [facility];  Service: Urology;  Laterality: N/A;   Family History:  Family History  Problem Relation Age of Onset  . Diabetes Mother   . Stroke Mother   . Diabetes Father   . Heart disease Father   . Heart disease Brother   . Heart disease Paternal Grandfather    Family Psychiatric  History: Denies  Social History:  History  Alcohol Use  . Yes    Comment: Daily. Last drink: 3 hours ago     History  Drug Use  . Types: Cocaine    Comment: Herion, Last used: yesterday    Social History   Social History  . Marital status: Divorced    Spouse name: N/A  . Number of children: N/A  . Years of education: N/A   Social History Main Topics  . Smoking status: Current Every Day Smoker    Packs/day: 1.00    Years: 40.00    Types: Cigarettes  . Smokeless tobacco: Never Used  . Alcohol use Yes     Comment: Daily. Last drink: 3 hours ago  . Drug use: Yes    Types: Cocaine     Comment: Herion, Last used: yesterday  . Sexual activity: Not Asked   Other Topics Concern  .  None   Social History Narrative  . None   Additional Social History:  Sleep: Good  Appetite:  Good  Current Medications: Current Facility-Administered Medications  Medication Dose Route Frequency Provider Last Rate Last Dose  . alum & mag hydroxide-simeth (MAALOX/MYLANTA) 200-200-20 MG/5ML suspension 30 mL  30 mL Oral Q4H PRN Patrecia Pour, NP      . magnesium hydroxide (MILK OF MAGNESIA) suspension 30 mL  30 mL Oral Daily PRN Patrecia Pour, NP      . multivitamin with minerals tablet 1 tablet  1 tablet Oral Daily Nanci Pina, FNP   1 tablet at 03/23/16 1129  .  nicotine polacrilex (NICORETTE) gum 2 mg  2 mg Oral PRN Nanci Pina, FNP   2 mg at 03/23/16 1128  . OLANZapine zydis (ZYPREXA) disintegrating tablet 5 mg  5 mg Oral Q8H PRN Nanci Pina, FNP      . thiamine (VITAMIN B-1) tablet 100 mg  100 mg Oral Daily Jenne Campus, MD   100 mg at 03/23/16 1128  . traZODone (DESYREL) tablet 50 mg  50 mg Oral QHS,MR X 1 Nanci Pina, FNP   50 mg at 03/27/16 2302   Lab Results:  No results found for this or any previous visit (from the past 48 hour(s)). Blood Alcohol level:  Lab Results  Component Value Date   ETH 6 (H) [id number]   Metabolic Disorder Labs: No results found for: HGBA1C, MPG No results found for: PROLACTIN No results found for: CHOL, TRIG, HDL, CHOLHDL, VLDL, LDLCALC  Physical Findings: AIMS: Facial and Oral Movements Muscles of Facial Expression: None, normal Lips and Perioral Area: None, normal Jaw: None, normal Tongue: None, normal,Extremity Movements Upper (arms, wrists, hands, fingers): None, normal Lower (legs, knees, ankles, toes): None, normal, Trunk Movements Neck, shoulders, hips: None, normal, Overall Severity Severity of abnormal movements (highest score from questions above): None, normal Incapacitation due to abnormal movements: None, normal Patient's awareness of abnormal movements (rate only patient's report): No Awareness, Dental Status Current problems with teeth and/or dentures?: No Does patient usually wear dentures?: No  CIWA:  CIWA-Ar Total: 2 COWS:     Musculoskeletal: Strength & Muscle Tone: within normal limits Gait & Station: normal Patient leans: N/A  Psychiatric Specialty Exam: Physical Exam  Vitals reviewed. Constitutional: He is oriented to person, place, and time. He appears well-developed.  Cardiovascular: Normal rate.   Neurological: He is alert and oriented to person, place, and time.  Psychiatric: He has a normal mood and affect. His behavior is normal.    Review of Systems   Psychiatric/Behavioral: Positive for depression. The patient is nervous/anxious.    does not endorse chest pain , no shortness of breath, no vomiting   Blood pressure 97/69, pulse 88, temperature 97.9 F (36.6 C), temperature source Oral, resp. rate 18, height 5\' 9"  (1.753 m), weight 71.7 kg (158 lb), SpO2 97 %.Body mass index is 23.33 kg/m.  General Appearance: Casual and Guarded  Eye Contact:  Fair  Speech:  Normal Rate   Volume:  Normal  Mood:  Denies feeling depressed, presents irritable  Affect:  Vaguely irritable, restricted   Thought Process:  Linear and Descriptions of Associations: Intact  Orientation:  Full (Time, Place, and Person)  Thought Content:  linear  Suicidal Thoughts:  No   Homicidal Thoughts:  No  Memory:   Recent and remote grossly intact   Judgement:  Fair  Insight:  Fair  Psychomotor Activity:  Decreased  Concentration:  Concentration: Fair and Attention Span: Fair  Recall:  Good  Fund of Knowledge:  Good  Language:  Fair  Akathisia:  No  Handed:  Right  AIMS (if indicated):     Assets:  Communication Skills Desire for Improvement Financial Resources/Insurance Leisure Time Physical Health Social Support Talents/Skills Vocational/Educational  ADL's:  Intact  Cognition:  WNL  Sleep:  Number of Hours: 5.5   Treatment Plan Summary: Daily contact with patient to assess and evaluate symptoms and progress in treatment and Medication management  Encourage group and milieu participation  Treatment team working on disposition planning, patient wants to go to a residential rehab program  Continue Trazodone 50 mgrs QHS PRN for insomnia Continue Vistaril 25 mgrs Q 6 hours PRN for anxiety Check TSH, result reviewed, within norm.  Encarnacion Slates, NP, PMHNP, FNP-BC 03/28/2016, 4:38 PM  Agree with NP Progress Note Patient ID: Dustin Esparza, male   DOB: 10-08-1960, 56 y.o.   MRN: [id number]

## 2016-03-28 NOTE — Progress Notes (Signed)
Patient ID: Dustin Esparza, male   DOB: 06-23-1960, 56 y.o.   MRN: [id number]   Pt currently presents with a flat affect and aitated behavior. Pt forwards little to writer, has multiple complaints about scheduling and the milieu. Irritable mood. Remains in the dayroom but has limited interaction with peers. Pt states "I have been having a hard time sleeping..."   Pt provided with medications per providers orders. Pt's labs and vitals were monitored throughout the night. Pt supported emotionally and encouraged to express concerns and questions. Provider consulted about patients concerns, see MAR. Pt educated on medications and alternative sleep inducing techniques.   Pt's safety ensured with 15 minute and environmental checks. Pt currently denies SI/HI and A/V hallucinations. Pt verbally agrees to seek staff if SI/HI or A/VH occurs and to consult with staff before acting on any harmful thoughts. Will continue POC.

## 2016-03-28 NOTE — Progress Notes (Signed)
Patient attended AA group meeting.  

## 2016-03-29 NOTE — Progress Notes (Signed)
[facility] MD Progress Note  03/29/2016 4:30 PM Dustin Esparza  MRN:  [id number]   Subjective: [name] states that he is doing ok.  He is voicing no concerns.  Objective:  [name] is awake, alert and oriented X 3. Seen resting in his room. He interacts with peers when out of his room & in the day room. Denies suicidal or homicidal ideation. Denies auditory or visual hallucination and does not appear to be responding to internal stimuli. Patient reports he is her for depression mainly,patient affect is flat and guarded. Patient is short with responses. Patient denies taking medications on a daily basis. Reports he has attended group sessions. Reports good appetite and states resting well. Support, encouragement and reassurance was provided.   Principal Problem: Major depressive disorder, single episode, severe without psychotic features ([place]) Diagnosis:   Patient Active Problem List   Diagnosis Date Noted  . Major depressive disorder, single episode, severe without psychotic features ([place]) [F32.2] 03/22/2016   Total Time spent with patient: 15 minutes  Previous Psychotropic Medications: States he has never been on any psychiatric medications.   Past Medical History:  Past Medical History:  Diagnosis Date  . Bladder cancer ([place])   . Dysuria-frequency syndrome   . Hematuria   . History of drug use    Quit cocaine Oct 2015  . History of narcotic addiction ([place])   . History of seizure    due to reaction advil/tylenol gel capsule  . HOH (hard of hearing)    right ear  . Recovering alcoholic [facility])    Quit Oct 2015  . Wears glasses     Past Surgical History:  Procedure Laterality Date  . CYSTOSCOPY WITH URETHRAL DILATATION N/A 10/31/2014   Procedure: CYSTOSCOPY WITH URETHRAL DILATATION;  Surgeon: Festus Aloe, MD;  Location: [facility];  Service: Urology;  Laterality: N/A;  . TRANSURETHRAL RESECTION OF BLADDER TUMOR N/A 10/31/2014   Procedure: TRANSURETHRAL RESECTION OF  BLADDER TUMOR (TURBT);  Surgeon: Festus Aloe, MD;  Location: [facility];  Service: Urology;  Laterality: N/A;  . TRANSURETHRAL RESECTION OF BLADDER TUMOR N/A 12/05/2014   Procedure: TRANSURETHRAL RESECTION OF BLADDER TUMOR (TURBT);  Surgeon: Festus Aloe, MD;  Location: [facility];  Service: Urology;  Laterality: N/A;   Family History:  Family History  Problem Relation Age of Onset  . Diabetes Mother   . Stroke Mother   . Diabetes Father   . Heart disease Father   . Heart disease Brother   . Heart disease Paternal Grandfather    Family Psychiatric  History: Denies  Social History:  History  Alcohol Use  . Yes    Comment: Daily. Last drink: 3 hours ago     History  Drug Use  . Types: Cocaine    Comment: Herion, Last used: yesterday    Social History   Social History  . Marital status: Divorced    Spouse name: N/A  . Number of children: N/A  . Years of education: N/A   Social History Main Topics  . Smoking status: Current Every Day Smoker    Packs/day: 1.00    Years: 40.00    Types: Cigarettes  . Smokeless tobacco: Never Used  . Alcohol use Yes     Comment: Daily. Last drink: 3 hours ago  . Drug use: Yes    Types: Cocaine     Comment: Herion, Last used: yesterday  . Sexual activity: Not Asked   Other Topics Concern  .  None   Social History Narrative  . None   Additional Social History:  Sleep: Good  Appetite:  Good  Current Medications: Current Facility-Administered Medications  Medication Dose Route Frequency Provider Last Rate Last Dose  . alum & mag hydroxide-simeth (MAALOX/MYLANTA) 200-200-20 MG/5ML suspension 30 mL  30 mL Oral Q4H PRN Patrecia Pour, NP      . magnesium hydroxide (MILK OF MAGNESIA) suspension 30 mL  30 mL Oral Daily PRN Patrecia Pour, NP      . multivitamin with minerals tablet 1 tablet  1 tablet Oral Daily Nanci Pina, FNP   1 tablet at 03/29/16 1311  . nicotine polacrilex (NICORETTE)  gum 2 mg  2 mg Oral PRN Nanci Pina, FNP   2 mg at 03/23/16 1128  . OLANZapine zydis (ZYPREXA) disintegrating tablet 5 mg  5 mg Oral Q8H PRN Nanci Pina, FNP      . thiamine (VITAMIN B-1) tablet 100 mg  100 mg Oral Daily Jenne Campus, MD   100 mg at 03/29/16 1311  . traZODone (DESYREL) tablet 50 mg  50 mg Oral QHS,MR X 1 Nanci Pina, FNP   50 mg at 03/27/16 2302   Lab Results:  No results found for this or any previous visit (from the past 48 hour(s)). Blood Alcohol level:  Lab Results  Component Value Date   ETH 6 (H) [id number]   Metabolic Disorder Labs: No results found for: HGBA1C, MPG No results found for: PROLACTIN No results found for: CHOL, TRIG, HDL, CHOLHDL, VLDL, LDLCALC  Physical Findings: AIMS: Facial and Oral Movements Muscles of Facial Expression: None, normal Lips and Perioral Area: None, normal Jaw: None, normal Tongue: None, normal,Extremity Movements Upper (arms, wrists, hands, fingers): None, normal Lower (legs, knees, ankles, toes): None, normal, Trunk Movements Neck, shoulders, hips: None, normal, Overall Severity Severity of abnormal movements (highest score from questions above): None, normal Incapacitation due to abnormal movements: None, normal Patient's awareness of abnormal movements (rate only patient's report): No Awareness, Dental Status Current problems with teeth and/or dentures?: No Does patient usually wear dentures?: No  CIWA:  CIWA-Ar Total: 2 COWS:     Musculoskeletal: Strength & Muscle Tone: within normal limits Gait & Station: normal Patient leans: N/A  Psychiatric Specialty Exam: Physical Exam  Vitals reviewed. Constitutional: He is oriented to person, place, and time. He appears well-developed.  Cardiovascular: Normal rate.   Neurological: He is alert and oriented to person, place, and time.  Psychiatric: He has a normal mood and affect. His behavior is normal.    Review of Systems  Psychiatric/Behavioral:  Positive for depression. The patient is nervous/anxious.    does not endorse chest pain , no shortness of breath, no vomiting   Blood pressure 120/80, pulse 82, temperature 97.6 F (36.4 C), resp. rate 20, height 5\' 9"  (1.753 m), weight 71.7 kg (158 lb), SpO2 97 %.Body mass index is 23.33 kg/m.  General Appearance: Casual and Guarded  Eye Contact:  Fair  Speech:  Normal Rate   Volume:  Normal  Mood:  Denies feeling depressed, presents irritable  Affect:  Vaguely irritable, restricted   Thought Process:  Linear and Descriptions of Associations: Intact  Orientation:  Full (Time, Place, and Person)  Thought Content:  linear  Suicidal Thoughts:  No   Homicidal Thoughts:  No  Memory:   Recent and remote grossly intact   Judgement:  Fair  Insight:  Fair  Psychomotor Activity:  Decreased  Concentration:  Concentration: Fair and Attention Span: Fair  Recall:  Good  Fund of Knowledge:  Good  Language:  Fair  Akathisia:  No  Handed:  Right  AIMS (if indicated):     Assets:  Communication Skills Desire for Improvement Financial Resources/Insurance Leisure Time Physical Health Social Support Talents/Skills Vocational/Educational  ADL's:  Intact  Cognition:  WNL  Sleep:  Number of Hours: 5.25   Treatment Plan Summary: Daily contact with patient to assess and evaluate symptoms and progress in treatment and Medication management  Encourage group and milieu participation  Treatment team working on disposition planning, patient wants to go to a residential rehab program  Continue Trazodone 50 mgrs QHS PRN for insomnia Continue Vistaril 25 mgrs Q 6 hours PRN for anxiety Check TSH, result reviewed, within norm.  Davy, NP-BC 03/29/2016, 4:30 PM

## 2016-03-29 NOTE — Progress Notes (Signed)
Patient attended AA group meeting.  

## 2016-03-29 NOTE — Progress Notes (Signed)
D) Pt has been attending the groups and interacting with select peers. Pt sits quietly in the dayroom and tends to be withdrawn. Did attend the program today and was joking about working on his Saturday packet. States, "I will be leaving here on Tuesday, just waiting out my time" Rates his depression at a 3, hopelessness at a 4 and his anxiety at a 0. Denies SI and HI. Is pleasant with interaction. Did state "I don't have medications in the morning". When asked to get OOB for his multivitamin and Vitamin B A) Therapeutic humor used with Pt. This afternoon while Pt was working on his 'homework'. States, "I want to feel better". Provided with several brief 1:1's throughout the day. R) Denies SI and HI. Sits in the dayroom interacting with select peers. States he is having some trouble with some of the other patients and their behavior

## 2016-03-29 NOTE — [facility] Group Notes (Signed)
Adult Therapy Group Note  Date:  03/29/2016 Time: 10:00-11:00am  Group Topic/Focus:  Discussion of healthy versus unhealthy coping techniques, and what specific healthy coping techniques patients would like to learn.  Examples given, reasons discussed, and healthy group conversation was held.  Healthy coping skills desired include:  Conversation abilities, anxiety management, asking for help, keeping focused on self, self-forgiveness and forgiveness for others, and connecting with higher power.  Participation Level:  Active  Participation Quality:  Attentive and Sharing  Affect:  Blunted  Cognitive:  Appropriate  Insight: Improving  Engagement in Group:  Engaged  Modes of Intervention:  Discussion and Exploration  Additional Comments:  Pt said he does not know what healthy coping skills he wants to learn, but a lot of what others said resonated with him and he contributed frequently to the discussion.  Berlin Hun Grossman-Orr 03/29/2016, 4:14 PM

## 2016-03-30 NOTE — Progress Notes (Signed)
[facility] MD Progress Note  03/30/2016 1:22 PM Dustin Esparza  MRN:  [id number]   Subjective:  Patient reports "  I am okay today just tired." Reports " just waiting to get into [facility]."   Objective:  Dustin Esparza is awake, alert and oriented *3. Seen resting in his bedroom.Denies suicidal or homicidal ideation. Denies auditory or visual hallucination and does not appear to be responding to internal stimuli. Patient continues to present with a flat affect. Patient is guarded and short with his responses. States he doesn't feel like attending group session this morning. Patient denies taking medications daily. Patient denies depression or depressive symptoms.  Reports good appetite and states he is  resting well. Support, encouragement and reassurance was provided.   Principal Problem: Major depressive disorder, single episode, severe without psychotic features ([place]) Diagnosis:   Patient Active Problem List   Diagnosis Date Noted  . Major depressive disorder, single episode, severe without psychotic features ([place]) [F32.2] 03/22/2016   Total Time spent with patient: 15 minutes  Previous Psychotropic Medications: States he has never been on any psychiatric medications.   Past Medical History:  Past Medical History:  Diagnosis Date  . Bladder cancer ([place])   . Dysuria-frequency syndrome   . Hematuria   . History of drug use    Quit cocaine Oct 2015  . History of narcotic addiction ([place])   . History of seizure    due to reaction advil/tylenol gel capsule  . HOH (hard of hearing)    right ear  . Recovering alcoholic [facility])    Quit Oct 2015  . Wears glasses     Past Surgical History:  Procedure Laterality Date  . CYSTOSCOPY WITH URETHRAL DILATATION N/A 10/31/2014   Procedure: CYSTOSCOPY WITH URETHRAL DILATATION;  Surgeon: Festus Aloe, MD;  Location: [facility];  Service: Urology;  Laterality: N/A;  . TRANSURETHRAL RESECTION OF BLADDER TUMOR N/A 10/31/2014    Procedure: TRANSURETHRAL RESECTION OF BLADDER TUMOR (TURBT);  Surgeon: Festus Aloe, MD;  Location: [facility];  Service: Urology;  Laterality: N/A;  . TRANSURETHRAL RESECTION OF BLADDER TUMOR N/A 12/05/2014   Procedure: TRANSURETHRAL RESECTION OF BLADDER TUMOR (TURBT);  Surgeon: Festus Aloe, MD;  Location: [facility];  Service: Urology;  Laterality: N/A;   Family History:  Family History  Problem Relation Age of Onset  . Diabetes Mother   . Stroke Mother   . Diabetes Father   . Heart disease Father   . Heart disease Brother   . Heart disease Paternal Grandfather    Family Psychiatric  History: Denies  Social History:  History  Alcohol Use  . Yes    Comment: Daily. Last drink: 3 hours ago     History  Drug Use  . Types: Cocaine    Comment: Herion, Last used: yesterday    Social History   Social History  . Marital status: Divorced    Spouse name: N/A  . Number of children: N/A  . Years of education: N/A   Social History Main Topics  . Smoking status: Current Every Day Smoker    Packs/day: 1.00    Years: 40.00    Types: Cigarettes  . Smokeless tobacco: Never Used  . Alcohol use Yes     Comment: Daily. Last drink: 3 hours ago  . Drug use: Yes    Types: Cocaine     Comment: Herion, Last used: yesterday  . Sexual activity: Not Asked   Other Topics Concern  .  None   Social History Narrative  . None   Additional Social History:  Sleep: Good  Appetite:  Good  Current Medications: Current Facility-Administered Medications  Medication Dose Route Frequency Provider Last Rate Last Dose  . alum & mag hydroxide-simeth (MAALOX/MYLANTA) 200-200-20 MG/5ML suspension 30 mL  30 mL Oral Q4H PRN Patrecia Pour, NP      . magnesium hydroxide (MILK OF MAGNESIA) suspension 30 mL  30 mL Oral Daily PRN Patrecia Pour, NP      . multivitamin with minerals tablet 1 tablet  1 tablet Oral Daily Nanci Pina, FNP   1 tablet at 03/29/16  1311  . nicotine polacrilex (NICORETTE) gum 2 mg  2 mg Oral PRN Nanci Pina, FNP   2 mg at 03/23/16 1128  . OLANZapine zydis (ZYPREXA) disintegrating tablet 5 mg  5 mg Oral Q8H PRN Nanci Pina, FNP      . thiamine (VITAMIN B-1) tablet 100 mg  100 mg Oral Daily Jenne Campus, MD   100 mg at 03/29/16 1311  . traZODone (DESYREL) tablet 50 mg  50 mg Oral QHS,MR X 1 Nanci Pina, FNP   50 mg at 03/29/16 2305   Lab Results:  No results found for this or any previous visit (from the past 48 hour(s)). Blood Alcohol level:  Lab Results  Component Value Date   ETH 6 (H) [id number]   Metabolic Disorder Labs: No results found for: HGBA1C, MPG No results found for: PROLACTIN No results found for: CHOL, TRIG, HDL, CHOLHDL, VLDL, LDLCALC  Physical Findings: AIMS: Facial and Oral Movements Muscles of Facial Expression: None, normal Lips and Perioral Area: None, normal Jaw: None, normal Tongue: None, normal,Extremity Movements Upper (arms, wrists, hands, fingers): None, normal Lower (legs, knees, ankles, toes): None, normal, Trunk Movements Neck, shoulders, hips: None, normal, Overall Severity Severity of abnormal movements (highest score from questions above): None, normal Incapacitation due to abnormal movements: None, normal Patient's awareness of abnormal movements (rate only patient's report): No Awareness, Dental Status Current problems with teeth and/or dentures?: No Does patient usually wear dentures?: No  CIWA:  CIWA-Ar Total: 2 COWS:     Musculoskeletal: Strength & Muscle Tone: within normal limits Gait & Station: normal Patient leans: N/A  Psychiatric Specialty Exam: Physical Exam  Vitals reviewed. Constitutional: He is oriented to person, place, and time. He appears well-developed.  Cardiovascular: Normal rate.   Neurological: He is alert and oriented to person, place, and time.  Psychiatric: He has a normal mood and affect. His behavior is normal.    Review  of Systems  Psychiatric/Behavioral: Positive for depression. The patient is nervous/anxious.    does not endorse chest pain , no shortness of breath, no vomiting   Blood pressure 111/78, pulse 91, temperature 97.5 F (36.4 C), temperature source Oral, resp. rate 18, height 5\' 9"  (1.753 m), weight 71.7 kg (158 lb), SpO2 97 %.Body mass index is 23.33 kg/m.  General Appearance: Casual and Guarded  Eye Contact:  Fair  Speech:  Normal Rate   Volume:  Normal  Mood:  Denies feeling depressed, presents irritable  Affect:  Vaguely irritable, restricted   Thought Process:  Linear and Descriptions of Associations: Intact  Orientation:  Full (Time, Place, and Person)  Thought Content:  linear  Suicidal Thoughts:  No   Homicidal Thoughts:  No  Memory:   Recent and remote grossly intact   Judgement:  Fair  Insight:  Fair  Psychomotor Activity:  Decreased  Concentration:  Concentration: Fair and Attention Span: Fair  Recall:  Good  Fund of Knowledge:  Good  Language:  Fair  Akathisia:  No  Handed:  Right  AIMS (if indicated):     Assets:  Communication Skills Desire for Improvement Financial Resources/Insurance Physical Health Social Support Talents/Skills  ADL's:  Intact  Cognition:  WNL  Sleep:  Number of Hours: 6     I agree with current treatment plan on 03/30/2016, Patient seen face-to-face for psychiatric evaluation follow-up, chart reviewed. Reviewed the information documented and agree with the treatment plan.   Treatment Plan Summary: Daily contact with patient to assess and evaluate symptoms and progress in treatment and Medication management   Encourage group and milieu participation  Treatment team working on disposition planning, patient wants to go to a residential rehab program  Continue Trazodone 50 mgrs QHS PRN for insomnia Continue Vistaril 25 mgrs Q 6 hours PRN for anxiety Check TSH, result reviewed, within norm.  Derrill Center, NP 03/30/2016, 1:22 PM

## 2016-03-30 NOTE — Progress Notes (Signed)
Patient ID: Dustin Esparza, male   DOB: 1961-02-23, 56 y.o.   MRN: [id number]   Pt currently presents with a flat affect and depressed behavior. Mood is brighter today, interacts positively with peers. Pt forwards little to staff. Pt reports poor sleep currently, requests to take medication tonight.   Pt provided with medications per providers orders. Pt's labs and vitals were monitored throughout the night. Pt supported emotionally and encouraged to express concerns and questions. Pt educated on medications.  Pt's safety ensured with 15 minute and environmental checks. Pt currently denies SI/HI and A/V hallucinations. Pt verbally agrees to seek staff if SI/HI or A/VH occurs and to consult with staff before acting on any harmful thoughts. Will continue POC.

## 2016-03-30 NOTE — Progress Notes (Signed)
Patient ID: Dustin Esparza, male   DOB: Aug 14, 1960, 56 y.o.   MRN: [id number]   Pt currently presents with a blunted affect and guarded behavior. Pt states "the best christmas was when I ordered a whole lobster, sent the family away and ate it in my chair while I watched the football game." Pt reports that he enjoys spending time by himself and plans to use this as a coping skill for anxiety. Pt reports good sleep with current medication regimen. Pt interacts positively with peers in the milieu.    Pt provided with medications per providers orders. Pt's labs and vitals were monitored throughout the night. Pt supported emotionally and encouraged to express concerns and questions. Pt educated on medications.  Pt's safety ensured with 15 minute and environmental checks. Pt currently denies SI/HI and A/V hallucinations. Pt verbally agrees to seek staff if SI/HI or A/VH occurs and to consult with staff before acting on any harmful thoughts. Will continue POC.

## 2016-03-30 NOTE — BHH Group Notes (Signed)
Progressive Relaxation  Date:  03/30/2016  Time:  0930  Type of Therapy:  Nurse Education  /  Progressive relaxation :  The group is focused on teaching patietns how to perform Progressive Relaxation and how it is a healthy coping skill to practice.  Participation Level:  Did Not Attend  Participation Quality:    Affect:    Cognitive:    Insight:    Engagement in Group:    Modes of Intervention:    Summary of Progress/Problems:  Lauralyn Primes 03/30/2016, 11:43 AM

## 2016-03-30 NOTE — BHH Group Notes (Signed)
[facility] Group Notes: (Clinical Social Work)   03/30/2016      Type of Therapy:  Group Therapy   Participation Level:  Did Not Attend despite MHT prompting   Selmer Dominion, LCSW 03/30/2016, 12:25 PM

## 2016-03-30 NOTE — Progress Notes (Signed)
D) Pt has been withdrawn to self. States that he takes his sleeping pill at 10 and another at 43. States that the medication didn't kick in until 3am and then "I couldn't get up this morning for breakfast". Feels that he is not getting the rest he needs and is ready to leave so he can get to the next level of his care. Rates his depression, hopelessness and anxiety all at a 0. Denies SI and HI A) Given support, reassurance and praise. Encouragement given. Provided with a 1:1. R) Pt denies SI and HI.

## 2016-03-30 NOTE — Progress Notes (Signed)
Patient attended AA group meeting.  

## 2016-03-31 DIAGNOSIS — F1420 Cocaine dependence, uncomplicated: Secondary | ICD-10-CM | POA: Diagnosis present

## 2016-03-31 DIAGNOSIS — F1020 Alcohol dependence, uncomplicated: Secondary | ICD-10-CM | POA: Diagnosis present

## 2016-03-31 NOTE — Progress Notes (Signed)
Recreation Therapy Notes  Date: 03/31/16 Time: 0930 Location: 300 Hall Group Room  Group Topic: Stress Management  Goal Area(s) Addresses:  Patient will verbalize importance of using healthy stress management.  Patient will identify positive emotions associated with healthy stress management.   Intervention: Guided Imagery  Activity :  Peaceful Place.  LRT introduced the stress management technique of guided imagery.  LRT read a script that allowed patients to take a mental journey to their favorite place.  Patients were to listen and follow along as the LRT read the script.  Education:  Stress Management, Discharge Planning.   Education Outcome: Acknowledges edcuation/In group clarification offered/Needs additional education  Clinical Observations/Feedback: Pt did not attend group.   Victorino Sparrow, LRT/CTRS         Victorino Sparrow A 03/31/2016 12:02 PM

## 2016-03-31 NOTE — Discharge Summary (Signed)
Physician Discharge Summary Note  Patient:  Dustin Esparza is an 56 y.o., male MRN:  [id number] DOB:  November 24, 1960 Patient phone:  [phone] (home)  Patient address:   [place] [address],  Total Time spent with patient: Greater than 30 minutes.  Date of Admission:  03/22/2016  Date of Discharge: 03-31-16  Reason for Admission: Worsening symptoms of depression.  Principal Problem: Major depressive disorder, single episode, severe without psychotic features [facility])  Discharge Diagnoses: Patient Active Problem List   Diagnosis Date Noted  . Major depressive disorder, single episode, severe without psychotic features ([place]) [F32.2] 03/22/2016   Past Psychiatric History: Major depressive disorder.  Past Medical History:  Past Medical History:  Diagnosis Date  . Bladder cancer ([place])   . Dysuria-frequency syndrome   . Hematuria   . History of drug use    Quit cocaine Oct 2015  . History of narcotic addiction ([place])   . History of seizure    due to reaction advil/tylenol gel capsule  . HOH (hard of hearing)    right ear  . Recovering alcoholic [facility])    Quit Oct 2015  . Wears glasses     Past Surgical History:  Procedure Laterality Date  . CYSTOSCOPY WITH URETHRAL DILATATION N/A 10/31/2014   Procedure: CYSTOSCOPY WITH URETHRAL DILATATION;  Surgeon: Festus Aloe, MD;  Location: [facility];  Service: Urology;  Laterality: N/A;  . TRANSURETHRAL RESECTION OF BLADDER TUMOR N/A 10/31/2014   Procedure: TRANSURETHRAL RESECTION OF BLADDER TUMOR (TURBT);  Surgeon: Festus Aloe, MD;  Location: [facility];  Service: Urology;  Laterality: N/A;  . TRANSURETHRAL RESECTION OF BLADDER TUMOR N/A 12/05/2014   Procedure: TRANSURETHRAL RESECTION OF BLADDER TUMOR (TURBT);  Surgeon: Festus Aloe, MD;  Location: [facility];  Service: Urology;  Laterality: N/A;   Family History:  Family History  Problem Relation Age of  Onset  . Diabetes Mother   . Stroke Mother   . Diabetes Father   . Heart disease Father   . Heart disease Brother   . Heart disease Paternal Grandfather    Family Psychiatric  History: See H&P  Social History:  History  Alcohol Use  . Yes    Comment: Daily. Last drink: 3 hours ago     History  Drug Use  . Types: Cocaine    Comment: Herion, Last used: yesterday    Social History   Social History  . Marital status: Divorced    Spouse name: N/A  . Number of children: N/A  . Years of education: N/A   Social History Main Topics  . Smoking status: Current Every Day Smoker    Packs/day: 1.00    Years: 40.00    Types: Cigarettes  . Smokeless tobacco: Never Used  . Alcohol use Yes     Comment: Daily. Last drink: 3 hours ago  . Drug use: Yes    Types: Cocaine     Comment: Herion, Last used: yesterday  . Sexual activity: Not Asked   Other Topics Concern  . None   Social History Narrative  . None   Hospital Course: 56 year old male. Reports that after a period of two years of sobriety relapsed about two months ago. Identifies crack cocaine as substance of choice. Was using crack cocaine daily, and states " I have burned through $ 7,000 dollars . To  a lesser degree has also used alcohol and occasionally heroin (once a week or so) . States  he was not drinking daily , 2-3 x per week ,and usually not to excess. States that in the context of relapse he has had a number of losses, mainly financial- lost job, apartment & states he is now homeless. He reports worsening depression, with recent onset of suicidal ideations, with thoughts of being hit by a car.  Upon his arrival & admision to the [facility] adult unit, Dustin Esparza was evaluated & his presenting symptoms identified. His  recent lab results upon admission showed his UDS positive for Cocaine & a BAL of >6 per toxicology test results. He reported on admission that he has been using cocaine, occasional heroin & alcohol. However, on  arrival to the [facility] adult unit, Dustin Esparza was not presenting with any substance withdrawal symptoms. As a result did not receive any detoxification treatments. He was not interested in taking or being on any medications. He was rather enrolled & encouraged to participate in the unit the group counseling sessions being offered & held on this unit. He learned coping skills. He presented no other significant health issues that required treatment or monitoring. Dustin Esparza tolerated his treatment regimen without any adverse effects or reactions reported.  During the course of his hospitalization, Dustin Esparza was evaluated on daily basis by the clinical providers to assure his response to his treatment regimen.As his treatment progressed, improvement was noted as evidenced by his reports of decreasing symptoms, improved mood, affect, medication tolerance & active participation in the unit programming.He was encouraged to update his providers on his progress by daily completion of a self inventory assessment, noting mood, mental status, pain, any new symptoms, anxiety & or concerns.  Dustin Esparza's symptoms did gradually improve as the days go by combined with a therapeutic & supportive environment. He was motivated for recovery as evidenced by his positive/appropriate behavior & his interaction with the staff & fellow patients.He also worked closely with the treatment team & case manager to develop a discharge plan with appropriate goals to maintain mood stability/sobriety after discharge.   Upon his hospital discharge, Dustin Esparza was in much improved condition than upon admission.His symptoms were reported as significantly decreased or resolved completely. He adamantly denies any SI/HI,  AVH, delusional thoughts & or paranoia. He is being discharged to continue substance abuse treatment at the [facility] treatment center as noted below. He is provided with all the necessary information required to make this appointment  without problems.Dustin Esparza left [facility] with all personal belongings in no apparent distress. Transportation per taxi cab. [place] assisted with taxi voucher.  Physical Findings: AIMS: Facial and Oral Movements Muscles of Facial Expression: None, normal Lips and Perioral Area: None, normal Jaw: None, normal Tongue: None, normal,Extremity Movements Upper (arms, wrists, hands, fingers): None, normal Lower (legs, knees, ankles, toes): None, normal, Trunk Movements Neck, shoulders, hips: None, normal, Overall Severity Severity of abnormal movements (highest score from questions above): None, normal Incapacitation due to abnormal movements: None, normal Patient's awareness of abnormal movements (rate only patient's report): No Awareness, Dental Status Current problems with teeth and/or dentures?: No Does patient usually wear dentures?: No  CIWA:  CIWA-Ar Total: 2 COWS:     Musculoskeletal: Strength & Muscle Tone: within normal limits Gait & Station: normal Patient leans: N/A  Psychiatric Specialty Exam: Physical Exam  Constitutional: He appears well-developed.  HENT:  Head: Normocephalic.  Eyes: Pupils are equal, round, and reactive to light.  Neck: Normal range of motion.  Cardiovascular: Normal rate.   Respiratory: Effort normal.  Genitourinary:  Genitourinary Comments: Deferred  Musculoskeletal: Normal range of motion.  Neurological: He is alert.  Skin: Skin is warm.    Review of Systems  Constitutional: Negative.   HENT: Negative.   Eyes: Negative.   Respiratory: Negative.   Cardiovascular: Negative.   Genitourinary: Negative.   Musculoskeletal: Negative.   Skin: Negative.   Neurological: Negative.   Endo/Heme/Allergies: Negative.   Psychiatric/Behavioral: Positive for depression (Stable). Negative for hallucinations, memory loss, substance abuse and suicidal ideas. The patient is not nervous/anxious and does not have insomnia.     Blood pressure 106/77, pulse 89,  temperature 98.4 F (36.9 C), temperature source Oral, resp. rate 18, height 5\' 9"  (1.753 m), weight 71.7 kg (158 lb), SpO2 97 %.Body mass index is 23.33 kg/m.  See Md's SRA   Have you used any form of tobacco in the last 30 days? (Cigarettes, Smokeless Tobacco, Cigars, and/or Pipes): Yes  Has this patient used any form of tobacco in the last 30 days? (Cigarettes, Smokeless Tobacco, Cigars, and/or Pipes): No  Blood Alcohol level:  Lab Results  Component Value Date   ETH 6 (H) [id number]   Metabolic Disorder Labs:  No results found for: HGBA1C, MPG No results found for: PROLACTIN No results found for: CHOL, TRIG, HDL, CHOLHDL, VLDL, LDLCALC  See Psychiatric Specialty Exam and Suicide Risk Assessment completed by Attending Physician prior to discharge.  Discharge destination:  Home  Is patient on multiple antipsychotic therapies at discharge:  No   Has Patient had three or more failed trials of antipsychotic monotherapy by history:  No  Recommended Plan for Multiple Antipsychotic Therapies: NA  Allergies as of 03/31/2016      Reactions   Acetaminophen Anaphylaxis   Advil Cold & Sinus Liqui-gels [pseudoephedrine-ibuprofen] Anaphylaxis   Ibuprofen Anaphylaxis, Hives, Swelling          Medication List    You have not been prescribed any medications.    Follow-up Information    [facility] Follow up on 04/01/2016.   Why:  7:45AM.  Contact information: [place] [address] [phone]        [facility] Follow up.   Specialty:  Behavioral Health Why:  Walk in between 8am-9am Monday through Friday for hospital follow-up/medication management/counseling. Thank you.  Contact information: [place] [address] [phone]          Follow-up recommendations: Activity:  As tolerated Diet: As recommended by your primary care doctor. Keep all scheduled follow-up appointments as recommended.   Comments: Patient is  instructed prior to discharge to: Take all medications as prescribed by his/her mental healthcare provider. Report any adverse effects and or reactions from the medicines to his/her outpatient provider promptly. Patient has been instructed & cautioned: To not engage in alcohol and or illegal drug use while on prescription medicines. In the event of worsening symptoms, patient is instructed to call the crisis hotline, 911 and or go to the nearest ED for appropriate evaluation and treatment of symptoms. To follow-up with his/her primary care provider for your other medical issues, concerns and or health care needs.   Signed: Encarnacion Slates, NP, PMHNP, FNP-BC 03/31/2016, 3:25 PM

## 2016-03-31 NOTE — BHH Group Notes (Signed)
[place] LCSW Group Therapy  03/31/2016 3:45 PM  Type of Therapy:  Group Therapy  Participation Level:  Active  Participation Quality:  Attentive  Affect:  Appropriate  Cognitive:  Alert and Oriented  Insight:  Engaged  Engagement in Therapy:  Improving  Modes of Intervention:  Confrontation, Discussion, Education, Problem-solving, Socialization and Support  Summary of Progress/Problems: Today's Topic: Overcoming Obstacles. Patients identified one short term goal and potential obstacles in reaching this goal. Patients processed barriers involved in overcoming these obstacles. Patients identified steps necessary for overcoming these obstacles and explored motivation (internal and external) for facing these difficulties head on.   Dustin Esparza [name] LCSW 03/31/2016, 3:45 PM

## 2016-03-31 NOTE — Plan of Care (Signed)
Problem: Self-Concept: Goal: Ability to disclose and discuss suicidal ideas will improve Outcome: Progressing Patient denies suicidal ideation  Problem: Safety: Goal: Ability to remain free from injury will improve Outcome: Progressing Safety maintained on unit

## 2016-03-31 NOTE — Progress Notes (Signed)
[facility] MD Progress Note  03/31/2016 12:51 PM Dustin Esparza  MRN:  [id number]   Subjective: [name] reports, "I am feeling fine" just waiting to get into the [facility] Residential treatment facility in the morning. Now, I understand that this visit will be for screening only, what happens if they did not accept me after the screening process".   Objective: Dustin Esparza is seen, chart reviewed. He is awake, alert and oriented X 3. Seen resting in the dayroom. He denies any suicidal or homicidal ideation. Denies any auditory or visual hallucination and does not appear to be responding to internal stimuli. Patient is short with his responses. Patient denies taking medications daily. Patient denies depression or depressive symptoms. Reports good appetite & states he is  resting well. He is also wandering what will happen if he did not get into the residential treatment center after his screening tomorrow? Support, encouragement and reassurance was provided.   Principal Problem: Major depressive disorder, single episode, severe without psychotic features ([place]) Diagnosis:   Patient Active Problem List   Diagnosis Date Noted  . Major depressive disorder, single episode, severe without psychotic features ([place]) [F32.2] 03/22/2016   Total Time spent with patient: 15 minutes  Previous Psychotropic Medications: States he has never been on any psychiatric medications.   Past Medical History:  Past Medical History:  Diagnosis Date  . Bladder cancer ([place])   . Dysuria-frequency syndrome   . Hematuria   . History of drug use    Quit cocaine Oct 2015  . History of narcotic addiction ([place])   . History of seizure    due to reaction advil/tylenol gel capsule  . HOH (hard of hearing)    right ear  . Recovering alcoholic [name]    Quit Oct 2015  . Wears glasses     Past Surgical History:  Procedure Laterality Date  . CYSTOSCOPY WITH URETHRAL DILATATION N/A 10/31/2014   Procedure: CYSTOSCOPY WITH URETHRAL  DILATATION;  Surgeon: Festus Aloe, MD;  Location[facility];  Service: Urology;  Laterality: N/A;  . TRANSURETHRAL RESECTION OF BLADDER TUMOR N/A 10/31/2014   Procedure: TRANSURETHRAL RESECTION OF BLADDER TUMOR (TURBT);  Surgeon: Festus Aloe, MD;  Location: [facility];  Service: Urology;  Laterality: N/A;  . TRANSURETHRAL RESECTION OF BLADDER TUMOR N/A 12/05/2014   Procedure: TRANSURETHRAL RESECTION OF BLADDER TUMOR (TURBT);  Surgeon: Festus Aloe, MD;  Location: [facility];  Service: Urology;  Laterality: N/A;   Family History:  Family History  Problem Relation Age of Onset  . Diabetes Mother   . Stroke Mother   . Diabetes Father   . Heart disease Father   . Heart disease Brother   . Heart disease Paternal Grandfather    Family Psychiatric  History: Denies  Social History:  History  Alcohol Use  . Yes    Comment: Daily. Last drink: 3 hours ago     History  Drug Use  . Types: Cocaine    Comment: Herion, Last used: yesterday    Social History   Social History  . Marital status: Divorced    Spouse name: N/A  . Number of children: N/A  . Years of education: N/A   Social History Main Topics  . Smoking status: Current Every Day Smoker    Packs/day: 1.00    Years: 40.00    Types: Cigarettes  . Smokeless tobacco: Never Used  . Alcohol use Yes     Comment: Daily. Last drink: 3 hours ago  .  Drug use: Yes    Types: Cocaine     Comment: Herion, Last used: yesterday  . Sexual activity: Not Asked   Other Topics Concern  . None   Social History Narrative  . None   Additional Social History:  Sleep: Good  Appetite:  Good  Current Medications: Current Facility-Administered Medications  Medication Dose Route Frequency Provider Last Rate Last Dose  . alum & mag hydroxide-simeth (MAALOX/MYLANTA) 200-200-20 MG/5ML suspension 30 mL  30 mL Oral Q4H PRN Patrecia Pour, NP      . magnesium hydroxide (MILK OF  MAGNESIA) suspension 30 mL  30 mL Oral Daily PRN Patrecia Pour, NP      . multivitamin with minerals tablet 1 tablet  1 tablet Oral Daily Nanci Pina, FNP   1 tablet at 03/29/16 1311  . nicotine polacrilex (NICORETTE) gum 2 mg  2 mg Oral PRN Nanci Pina, FNP   2 mg at 03/23/16 1128  . OLANZapine zydis (ZYPREXA) disintegrating tablet 5 mg  5 mg Oral Q8H PRN Nanci Pina, FNP      . thiamine (VITAMIN B-1) tablet 100 mg  100 mg Oral Daily Jenne Campus, MD   100 mg at 03/29/16 1311  . traZODone (DESYREL) tablet 50 mg  50 mg Oral QHS,MR X 1 Nanci Pina, FNP   50 mg at 03/29/16 2305   Lab Results:  No results found for this or any previous visit (from the past 48 hour(s)). Blood Alcohol level:  Lab Results  Component Value Date   ETH 6 (H) [id number]   Metabolic Disorder Labs: No results found for: HGBA1C, MPG No results found for: PROLACTIN No results found for: CHOL, TRIG, HDL, CHOLHDL, VLDL, LDLCALC  Physical Findings: AIMS: Facial and Oral Movements Muscles of Facial Expression: None, normal Lips and Perioral Area: None, normal Jaw: None, normal Tongue: None, normal,Extremity Movements Upper (arms, wrists, hands, fingers): None, normal Lower (legs, knees, ankles, toes): None, normal, Trunk Movements Neck, shoulders, hips: None, normal, Overall Severity Severity of abnormal movements (highest score from questions above): None, normal Incapacitation due to abnormal movements: None, normal Patient's awareness of abnormal movements (rate only patient's report): No Awareness, Dental Status Current problems with teeth and/or dentures?: No Does patient usually wear dentures?: No  CIWA:  CIWA-Ar Total: 2 COWS:     Musculoskeletal: Strength & Muscle Tone: within normal limits Gait & Station: normal Patient leans: N/A  Psychiatric Specialty Exam: Physical Exam  Vitals reviewed. Constitutional: He is oriented to person, place, and time. He appears well-developed.   Cardiovascular: Normal rate.   Neurological: He is alert and oriented to person, place, and time.  Psychiatric: He has a normal mood and affect. His behavior is normal.    Review of Systems  Psychiatric/Behavioral: Positive for depression. The patient is nervous/anxious.    does not endorse chest pain , no shortness of breath, no vomiting   Blood pressure 106/77, pulse 89, temperature 98.4 F (36.9 C), temperature source Oral, resp. rate 18, height 5\' 9"  (1.753 m), weight 71.7 kg (158 lb), SpO2 97 %.Body mass index is 23.33 kg/m.  General Appearance: Casual and Guarded  Eye Contact:  Fair  Speech:  Normal Rate   Volume:  Normal  Mood:  Denies feeling depressed, presents irritable  Affect:  Vaguely irritable, restricted   Thought Process:  Linear and Descriptions of Associations: Intact  Orientation:  Full (Time, Place, and Person)  Thought Content:  linear  Suicidal Thoughts:  No   Homicidal Thoughts:  No  Memory:   Recent and remote grossly intact   Judgement:  Fair  Insight:  Fair  Psychomotor Activity:  Decreased  Concentration:  Concentration: Fair and Attention Span: Fair  Recall:  Good  Fund of Knowledge:  Good  Language:  Fair  Akathisia:  No  Handed:  Right  AIMS (if indicated):     Assets:  Communication Skills Desire for Improvement Financial Resources/Insurance Physical Health Social Support Talents/Skills  ADL's:  Intact  Cognition:  WNL  Sleep:  Number of Hours: 4.25   Treatment Plan Summary: Daily contact with patient to assess and evaluate symptoms and progress in treatment and Medication management   Encourage group and milieu participation  Treatment team working on disposition planning, patient wants to go to a residential rehab program  Will continue Trazodone 50 mgrs QHS PRN for insomnia Will continue Vistaril 25 mgrs Q 6 hours PRN for anxiety Check TSH, result reviewed, within norm.  Encarnacion Slates, NP, PMHNP, FNP-BC. 03/31/2016, 12:51 PM   Patient ID: Dustin Esparza, male   DOB: 1960/10/01, 56 y.o.   MRN: [id number]

## 2016-03-31 NOTE — Progress Notes (Signed)
D:  Patient refused to get out of bed to take medications.  He said "all I'm taking is vitamins and I don't want them". he denies suicidal and homicidal ideation and AVH; no self-injurious behaviors noted or reported.  Affect flat; mood depressed/irritable. A:  Emotional support provided; encouraged him to seek assistance with needs/concerns. R:  Safety maintained on unit.

## 2016-03-31 NOTE — Tx Team (Signed)
Interdisciplinary Treatment and Diagnostic Plan Update  03/31/2016 Time of Session: 9:30AM Dustin Esparza MRN: [id number]  Principal Diagnosis: Major depressive disorder, single episode, severe without psychotic features ([place])  Secondary Diagnoses: Principal Problem:   Major depressive disorder, single episode, severe without psychotic features ([place])   Current Medications:  Current Facility-Administered Medications  Medication Dose Route Frequency Provider Last Rate Last Dose  . alum & mag hydroxide-simeth (MAALOX/MYLANTA) 200-200-20 MG/5ML suspension 30 mL  30 mL Oral Q4H PRN Patrecia Pour, NP      . magnesium hydroxide (MILK OF MAGNESIA) suspension 30 mL  30 mL Oral Daily PRN Patrecia Pour, NP      . multivitamin with minerals tablet 1 tablet  1 tablet Oral Daily Nanci Pina, FNP   1 tablet at 03/29/16 1311  . nicotine polacrilex (NICORETTE) gum 2 mg  2 mg Oral PRN Nanci Pina, FNP   2 mg at 03/23/16 1128  . OLANZapine zydis (ZYPREXA) disintegrating tablet 5 mg  5 mg Oral Q8H PRN Nanci Pina, FNP      . thiamine (VITAMIN B-1) tablet 100 mg  100 mg Oral Daily Jenne Campus, MD   100 mg at 03/29/16 1311  . traZODone (DESYREL) tablet 50 mg  50 mg Oral QHS,MR X 1 Nanci Pina, FNP   50 mg at 03/29/16 2305   PTA Medications: No prescriptions prior to admission.    Patient Stressors: Financial difficulties Occupational concerns Substance abuse Other: Housing concerns  Patient Strengths: Active sense of humor Average or above average Architect for treatment/growth Physical Health  Treatment Modalities: Medication Management, Group therapy, Case management,  1 to 1 session with clinician, Psychoeducation, Recreational therapy.   Physician Treatment Plan for Primary Diagnosis: Major depressive disorder, single episode, severe without psychotic features ([place]) Long Term Goal(s): Improvement in symptoms so as ready for  discharge Improvement in symptoms so as ready for discharge   Short Term Goals: Ability to identify triggers associated with substance abuse/mental health issues will improve Ability to verbalize feelings will improve Ability to disclose and discuss suicidal ideas Ability to demonstrate self-control will improve Ability to identify and develop effective coping behaviors will improve  Medication Management: Evaluate patient's response, side effects, and tolerance of medication regimen.  Therapeutic Interventions: 1 to 1 sessions, Unit Group sessions and Medication administration.  Evaluation of Outcomes: Adequate for discharge   Physician Treatment Plan for Secondary Diagnosis: Principal Problem:   Major depressive disorder, single episode, severe without psychotic features ([place])  Long Term Goal(s): Improvement in symptoms so as ready for discharge Improvement in symptoms so as ready for discharge   Short Term Goals: Ability to identify triggers associated with substance abuse/mental health issues will improve Ability to verbalize feelings will improve Ability to disclose and discuss suicidal ideas Ability to demonstrate self-control will improve Ability to identify and develop effective coping behaviors will improve     Medication Management: Evaluate patient's response, side effects, and tolerance of medication regimen.  Therapeutic Interventions: 1 to 1 sessions, Unit Group sessions and Medication administration.  Evaluation of Outcomes: Met   RN Treatment Plan for Primary Diagnosis: Major depressive disorder, single episode, severe without psychotic features ([place]) Long Term Goal(s): Knowledge of disease and therapeutic regimen to maintain health will improve  Short Term Goals: Ability to remain free from injury will improve, Ability to verbalize feelings will improve and Ability to disclose and discuss suicidal ideas  Medication Management: RN will administer medications  as  ordered by provider, will assess and evaluate patient's response and provide education to patient for prescribed medication. RN will report any adverse and/or side effects to prescribing provider.  Therapeutic Interventions: 1 on 1 counseling sessions, Psychoeducation, Medication administration, Evaluate responses to treatment, Monitor vital signs and CBGs as ordered, Perform/monitor CIWA, COWS, AIMS and Fall Risk screenings as ordered, Perform wound care treatments as ordered.  Evaluation of Outcomes: Met   LCSW Treatment Plan for Primary Diagnosis: Major depressive disorder, single episode, severe without psychotic features ([place]) Long Term Goal(s): Safe transition to appropriate next level of care at discharge, Engage patient in therapeutic group addressing interpersonal concerns.  Short Term Goals: Engage patient in aftercare planning with referrals and resources, Facilitate patient progression through stages of change regarding substance use diagnoses and concerns and Identify triggers associated with mental health/substance abuse issues  Therapeutic Interventions: Assess for all discharge needs, 1 to 1 time with Social worker, Explore available resources and support systems, Assess for adequacy in community support network, Educate family and significant other(s) on suicide prevention, Complete Psychosocial Assessment, Interpersonal group therapy.  Evaluation of Outcomes: Adequate for discharge    Progress in Treatment: Attending groups: Yes Participating in groups: Minimally, when pt attends  Taking medication as prescribed: Yes. Toleration medication: Yes Family/Significant other contact made: Pt declined family contact. SPE completed with patient.  Patient understands diagnosis: Yes. Discussing patient identified problems/goals with staff: Yes Medical problems stabilized or resolved: Yes. Denies suicidal/homicidal ideation: Yes. per self report Issues/concerns per patient  self-inventory: No. Other: n/a  New problem(s) identified: n/a   New Short Term/Long Term Goal(s): detox; medication stabilization; development of comprehensive mental wellness/sobriety plan.   Discharge Plan or Barriers: [facility] screening scheduled for: Tuesday 1/23 at 7:45AM. Pt will take taxi on Tuesday morning and requires 30 days supply of medications. [facility] for o/p care.   Reason for Continuation of Hospitalization: Medication management  Estimated Length of Stay: 1 day (discharge scheduled for Tuesday AM--pt going to [facility] for screening and must discharge no later than 7:00AM for 7:45AM screening).   Attendees: Patient: 03/31/2016 8:23 AM  Physician: Dr. Shea Evans MD 03/31/2016 8:23 AM  Nursing: Quintin Alto RN 03/31/2016 8:23 AM  RN Care Manager: Lars Pinks CM 03/31/2016 8:23 AM  Social Worker: Press photographer, LCSW; Adriana Reams LCSW; Edwyna Shell LCSW 03/31/2016 8:23 AM  Recreational Therapist: Rhunette Croft 03/31/2016 8:23 AM  Other: Ricky Ala NP; Lindell Spar NP 03/31/2016 8:23 AM  Other:  03/31/2016 8:23 AM  Other: 03/31/2016 8:23 AM    Scribe for Treatment Team: [place], LCSW 03/31/2016 8:23 AM

## 2016-03-31 NOTE — BHH Suicide Risk Assessment (Signed)
[facility] Discharge Suicide Risk Assessment   Principal Problem: Major depressive disorder, single episode, severe without psychotic features [facility]) Discharge Diagnoses:  Patient Active Problem List   Diagnosis Date Noted  . Cocaine use disorder, severe, dependence ([place]) [F14.20] 03/31/2016  . Alcohol use disorder, moderate, dependence ([place]) [F10.20] 03/31/2016  . Major depressive disorder, single episode, severe without psychotic features ([place]) [F32.2] 03/22/2016    Total Time spent with patient: 30 minutes  Musculoskeletal: Strength & Muscle Tone: within normal limits Gait & Station: normal Patient leans: N/A  Psychiatric Specialty Exam: Review of Systems  Psychiatric/Behavioral: Positive for substance abuse. Negative for depression, hallucinations and suicidal ideas. The patient is not nervous/anxious and does not have insomnia.   All other systems reviewed and are negative.   Blood pressure 106/77, pulse 89, temperature 98.4 F (36.9 C), temperature source Oral, resp. rate 18, height 5\' 9"  (1.753 m), weight 71.7 kg (158 lb), SpO2 97 %.Body mass index is 23.33 kg/m.  General Appearance: Casual  Eye Contact::  Fair  Speech:  Clear and Coherent409  Volume:  Normal  Mood:  Euthymic  Affect:  Appropriate  Thought Process:  Goal Directed and Descriptions of Associations: Intact  Orientation:  Full (Time, Place, and Person)  Thought Content:  Logical  Suicidal Thoughts:  No  Homicidal Thoughts:  No  Memory:  Immediate;   Fair Recent;   Fair Remote;   Fair  Judgement:  Fair  Insight:  Fair  Psychomotor Activity:  Normal  Concentration:  Fair  Recall:  [employer] of Knowledge:Fair  Language: Fair  Akathisia:  No  Handed:  Right  AIMS (if indicated):     Assets:  Communication Skills Desire for Improvement  Sleep:  Number of Hours: 4.25  Cognition: WNL  ADL's:  Intact   Mental Status Per Nursing Assessment::   On Admission:  Suicidal ideation indicated by patient, Suicide  plan, Plan includes specific time, place, or method, Self-harm thoughts, Self-harm behaviors, Intention to act on suicide plan  Demographic Factors:  Male  Loss Factors: NA  Historical Factors: Impulsivity  Risk Reduction Factors:   Positive social support and Positive therapeutic relationship  Continued Clinical Symptoms:  Alcohol/Substance Abuse/Dependencies  Cognitive Features That Contribute To Risk:  None    Suicide Risk:  Minimal: No identifiable suicidal ideation.  Patients presenting with no risk factors but with morbid ruminations; may be classified as minimal risk based on the severity of the depressive symptoms  Follow-up Information    [facility] Follow up on 04/01/2016.   Why:  7:45AM.  Contact information: [place] [address] [phone]        [facility] Follow up.   Specialty:  Behavioral Health Why:  Walk in between 8am-9am Monday through Friday for hospital follow-up/medication management/counseling. Thank you.  Contact information: [place] [address] [phone]           Plan Of Care/Follow-up recommendations:  Activity:  NO RESTRICTIONS Diet:  REGULAR Other:  NONE  Noor Witte, MD 03/31/2016, 4:02 PM

## 2016-03-31 NOTE — Progress Notes (Signed)
Discharge instructions including AVS, Transition Report, and Suicide Risk Assessment reviewed with patient with verbalization of understanding;  Taxi voucher on chart; Patient to be discharged tomorrow 04/01/2016 at approximately 0700.  Taxi voucher on chart. At present time patient denies suicidal and homicidal ideation. No self-injurious behaviors noted or reported.

## 2016-03-31 NOTE — Progress Notes (Signed)
Nursing Progress Note: 7p-7a D: Pt currently presents with a irratable affect and behavior. Pt states "I am ready to go home." Interacting with milieu. Pt reports ok sleep with current medication regimen.   A: Pt provided with medications per providers orders. Pt's labs and vitals were monitored throughout the night. Pt supported emotionally and encouraged to express concerns and questions. Pt educated on medications.  R: Pt's safety ensured with 15 minute and environmental checks. Pt currently denies SI/HI/Self Harm and A/V hallucinations. Pt verbally contracts to seek staff if SI/HI or A/VH occurs and to consult with staff before acting on any harmful thoughts. Will continue to monitor.

## 2016-03-31 NOTE — Progress Notes (Signed)
  [facility] Adult Case Management Discharge Plan :  Will you be returning to the same living situation after discharge:  No. Pt has [facility] on 04/01/16 at 7:45AM.  At discharge, do you have transportation home?: Yes,  taxi voucher in chart. Pt must discharge no later than 7:00AM on Tuesday 04/01/16. RN notified. Do you have the ability to pay for your medications: Yes,  mental health  Release of information consent forms completed and submitted to medical records by CSW.  Patient to Follow up at: Follow-up Information    [facility] Follow up on 04/01/2016.   Why:  7:45AM.  Contact information: [place] [address] [phone]        [facility] Follow up.   Specialty:  Behavioral Health Why:  Walk in between 8am-9am Monday through Friday for hospital follow-up/medication management/counseling. Thank you.  Contact information: [place] [address] [phone]           Next level of care provider has access to [place] and Suicide Prevention discussed: Yes,  SPE completed with pt; pt declined to consent to family contact.   Have you used any form of tobacco in the last 30 days? (Cigarettes, Smokeless Tobacco, Cigars, and/or Pipes): Yes  Has patient been referred to the Quitline?: Patient refused referral  Patient has been referred for addiction treatment: Yes  Kimber Relic Smart LCSW 03/31/2016, 9:37 AM

## 2016-05-13 ENCOUNTER — Other Ambulatory Visit: Payer: Self-pay

## 2016-05-13 ENCOUNTER — Encounter (HOSPITAL_COMMUNITY): Payer: Self-pay

## 2016-05-13 ENCOUNTER — Emergency Department (HOSPITAL_COMMUNITY)
Admission: EM | Admit: 2016-05-13 | Discharge: 2016-05-14 | Disposition: A | Discharge status: Home / Self Care | Payer: Federal, State, Local not specified - Other | Attending: Emergency Medicine | Admitting: Emergency Medicine

## 2016-05-13 DIAGNOSIS — Z8551 Personal history of malignant neoplasm of bladder: Secondary | ICD-10-CM | POA: Insufficient documentation

## 2016-05-13 DIAGNOSIS — F1420 Cocaine dependence, uncomplicated: Secondary | ICD-10-CM | POA: Diagnosis present

## 2016-05-13 DIAGNOSIS — T5802XA Toxic effect of carbon monoxide from motor vehicle exhaust, intentional self-harm, initial encounter: Secondary | ICD-10-CM | POA: Insufficient documentation

## 2016-05-13 DIAGNOSIS — F1410 Cocaine abuse, uncomplicated: Secondary | ICD-10-CM | POA: Insufficient documentation

## 2016-05-13 DIAGNOSIS — F1414 Cocaine abuse with cocaine-induced mood disorder: Secondary | ICD-10-CM | POA: Insufficient documentation

## 2016-05-13 DIAGNOSIS — F322 Major depressive disorder, single episode, severe without psychotic features: Secondary | ICD-10-CM | POA: Diagnosis present

## 2016-05-13 DIAGNOSIS — Z5181 Encounter for therapeutic drug level monitoring: Secondary | ICD-10-CM | POA: Insufficient documentation

## 2016-05-13 DIAGNOSIS — T1491XA Suicide attempt, initial encounter: Secondary | ICD-10-CM

## 2016-05-13 DIAGNOSIS — F1910 Other psychoactive substance abuse, uncomplicated: Secondary | ICD-10-CM

## 2016-05-13 DIAGNOSIS — F17210 Nicotine dependence, cigarettes, uncomplicated: Secondary | ICD-10-CM | POA: Insufficient documentation

## 2016-05-13 LAB — COMPREHENSIVE METABOLIC PANEL
ALBUMIN: 4.2 g/dL (ref 3.5–5.0)
ALK PHOS: 83 U/L (ref 38–126)
ALT: 18 U/L (ref 17–63)
AST: 18 U/L (ref 15–41)
Anion gap: 7 (ref 5–15)
BUN: 7 mg/dL (ref 6–20)
CALCIUM: 9.2 mg/dL (ref 8.9–10.3)
CO2: 26 mmol/L (ref 22–32)
Chloride: 105 mmol/L (ref 101–111)
Creatinine, Ser: 0.89 mg/dL (ref 0.61–1.24)
GFR calc non Af Amer: >60 mL/min (ref >60)
Glucose, Bld: 82 mg/dL (ref 65–99)
POTASSIUM: 3.8 mmol/L (ref 3.5–5.1)
SODIUM: 138 mmol/L (ref 135–145)
TOTAL PROTEIN: 7.8 g/dL (ref 6.5–8.1)
Total Bilirubin: 0.9 mg/dL (ref 0.3–1.2)

## 2016-05-13 LAB — CBC
HCT: 44.4 % (ref 39.0–52.0)
HEMOGLOBIN: 15.5 g/dL (ref 13.0–17.0)
MCH: 31.1 pg (ref 26.0–34.0)
MCHC: 34.9 g/dL (ref 30.0–36.0)
MCV: 89.2 fL (ref 78.0–100.0)
Platelets: 393 10*3/uL (ref 150–400)
RBC: 4.98 MIL/uL (ref 4.22–5.81)
RDW: 15.2 % (ref 11.5–15.5)
WBC: 15.3 10*3/uL — AB (ref 4.0–10.5)

## 2016-05-13 LAB — ACETAMINOPHEN LEVEL: Acetaminophen (Tylenol), Serum: <10 ug/mL — ABNORMAL LOW (ref 10–30)

## 2016-05-13 LAB — COOXEMETRY PANEL
Carboxyhemoglobin: 3.6 % — ABNORMAL HIGH (ref 0.5–1.5)
Methemoglobin: 0.9 % (ref 0.0–1.5)
O2 Saturation: 84.9 %
TOTAL HEMOGLOBIN: 16.7 g/dL — AB (ref 12.0–16.0)

## 2016-05-13 LAB — RAPID URINE DRUG SCREEN, HOSP PERFORMED
AMPHETAMINES: NOT DETECTED
BARBITURATES: NOT DETECTED
Benzodiazepines: NOT DETECTED
Cocaine: POSITIVE — AB
OPIATES: NOT DETECTED
TETRAHYDROCANNABINOL: NOT DETECTED

## 2016-05-13 LAB — SALICYLATE LEVEL

## 2016-05-13 LAB — ETHANOL: Alcohol, Ethyl (B): <5 mg/dL (ref <5)

## 2016-05-13 MED ORDER — LORAZEPAM 1 MG PO TABS: 1.0000 mg | ORAL_TABLET | Freq: Three times a day (TID) | ORAL | Status: DC | PRN

## 2016-05-13 MED ORDER — LORAZEPAM 1 MG PO TABS: 0.0000 mg | ORAL_TABLET | Freq: Four times a day (QID) | ORAL | Status: DC

## 2016-05-13 MED ORDER — ONDANSETRON HCL 4 MG PO TABS: 4.0000 mg | ORAL_TABLET | Freq: Three times a day (TID) | ORAL | Status: DC | PRN

## 2016-05-13 MED ORDER — GABAPENTIN 100 MG PO CAPS
200.0000 mg | ORAL_CAPSULE | Freq: Two times a day (BID) | ORAL | Status: DC
  Administered 2016-05-13 – 2016-05-14 (×3): 200 mg via ORAL
  Filled 2016-05-13 (×3): qty 2

## 2016-05-13 MED ORDER — ZOLPIDEM TARTRATE 5 MG PO TABS: 5.0000 mg | ORAL_TABLET | Freq: Every evening | ORAL | Status: DC | PRN

## 2016-05-13 MED ORDER — LORAZEPAM 1 MG PO TABS: 0.0000 mg | ORAL_TABLET | Freq: Two times a day (BID) | ORAL | Status: DC

## 2016-05-13 MED ORDER — HYDROXYZINE HCL 25 MG PO TABS
25.0000 mg | ORAL_TABLET | Freq: Three times a day (TID) | ORAL | Status: DC | PRN
Start: 2016-05-13 — End: 2016-05-14
  Administered 2016-05-13: 25 mg via ORAL
  Filled 2016-05-13: qty 1

## 2016-05-13 MED ORDER — FLUOXETINE HCL 10 MG PO CAPS
10.0000 mg | ORAL_CAPSULE | Freq: Every day | ORAL | Status: DC
  Administered 2016-05-13 – 2016-05-14 (×2): 10 mg via ORAL
  Filled 2016-05-13 (×2): qty 1

## 2016-05-13 MED ORDER — NICOTINE 21 MG/24HR TD PT24
21.0000 mg | MEDICATED_PATCH | Freq: Every day | TRANSDERMAL | Status: DC
  Filled 2016-05-13: qty 1

## 2016-05-13 MED ORDER — ALUM & MAG HYDROXIDE-SIMETH 200-200-20 MG/5ML PO SUSP: 30.0000 mL | ORAL | Status: DC | PRN

## 2016-05-13 MED ORDER — THIAMINE HCL 100 MG/ML IJ SOLN: 100.0000 mg | Freq: Every day | INTRAMUSCULAR | Status: DC

## 2016-05-13 MED ORDER — VITAMIN B-1 100 MG PO TABS: 100.0000 mg | ORAL_TABLET | Freq: Every day | ORAL | Status: DC

## 2016-05-13 NOTE — Progress Notes (Signed)
05/13/16 1359:  LRT went to pt room to offer activities, pt was sleep.  Victorino Sparrow, LRT/CTRS
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
## 2016-05-13 NOTE — ED Notes (Signed)
Pt sleeping at present, easily arouseable to verbal stimuli.  No distress noted,calm & cooperative.  Monitoring for safety, Q 15 min checks in effect. 

## 2016-05-13 NOTE — ED Notes (Signed)
Pt denies S/I and contracts for safety.  He is pleasant and cooperative.  He is showing no signs of withdrawal at this time.
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
## 2016-05-13 NOTE — ED Triage Notes (Signed)
Patient states he is using crack and heroin- patient wishes to stop using street drugs. Patient also states suicidal ideation with a plan - patient states "i left my scooter running in my room last night with hopes I wouldn't wake up this morning"

## 2016-05-13 NOTE — ED Notes (Signed)
Pt in room 35 Sleeping.  Pt appears in no distress.  Breathing is even and unlabored.  15 minute checks and video monitoring in place.

## 2016-05-14 DIAGNOSIS — F17210 Nicotine dependence, cigarettes, uncomplicated: Secondary | ICD-10-CM

## 2016-05-14 DIAGNOSIS — F1020 Alcohol dependence, uncomplicated: Secondary | ICD-10-CM

## 2016-05-14 DIAGNOSIS — Z79899 Other long term (current) drug therapy: Secondary | ICD-10-CM

## 2016-05-14 DIAGNOSIS — Z888 Allergy status to other drugs, medicaments and biological substances status: Secondary | ICD-10-CM | POA: Diagnosis not present

## 2016-05-14 DIAGNOSIS — F1414 Cocaine abuse with cocaine-induced mood disorder: Secondary | ICD-10-CM | POA: Diagnosis present

## 2016-05-14 MED ORDER — FLUOXETINE HCL 10 MG PO CAPS: 10.0000 mg | ORAL_CAPSULE | Freq: Every day | ORAL | 0 refills | Status: DC

## 2016-05-14 MED ORDER — GABAPENTIN 100 MG PO CAPS: 200.0000 mg | ORAL_CAPSULE | Freq: Two times a day (BID) | ORAL | 0 refills | Status: DC

## 2016-05-14 MED ORDER — HYDROXYZINE HCL 25 MG PO TABS: 25.0000 mg | ORAL_TABLET | Freq: Three times a day (TID) | ORAL | 0 refills | Status: DC | PRN

## 2016-05-14 NOTE — Discharge Instructions (Signed)
To help you maintain a sober lifestyle, a substance abuse treatment program may be beneficial to you.  Contact one of the following providers at your earliest opportunity to ask about enrolling in their program:       [facility]      [address] [place]. [place], [place] [address]      [phone]      New patients are seen at the walk-in clinic every Tuesday from 9:00 am - 12:00 pm.       [facility]      [place], [place] [address]      [phone]

## 2016-05-14 NOTE — BHH Suicide Risk Assessment (Signed)
Suicide Risk Assessment  Discharge Assessment   [facility] Discharge Suicide Risk Assessment   Principal Problem: Cocaine abuse with cocaine-induced mood disorder [facility]) Discharge Diagnoses:  Patient Active Problem List   Diagnosis Date Noted  . Cocaine abuse with cocaine-induced mood disorder ([place]) [F14.14] 05/14/2016    Priority: High  . Cocaine use disorder, severe, dependence ([place]) [F14.20] 03/31/2016  . Alcohol use disorder, moderate, dependence ([employer]) [F10.20] 03/31/2016    Total Time spent with patient: 45 minutes Musculoskeletal: Strength & Muscle Tone: within normal limits Gait & Station: normal Patient leans: N/A  Psychiatric Specialty Exam: Physical Exam  Constitutional: He is oriented to person, place, and time. He appears well-developed and well-nourished.  HENT:  Head: Normocephalic.  Neck: Normal range of motion.  Respiratory: Effort normal.  Musculoskeletal: Normal range of motion.  Neurological: He is alert and oriented to person, place, and time.  Psychiatric: He has a normal mood and affect. His speech is normal and behavior is normal. Judgment and thought content normal. Cognition and memory are normal.    Review of Systems  Psychiatric/Behavioral: Positive for substance abuse.  All other systems reviewed and are negative.   Blood pressure 103/73, pulse 72, temperature 97.7 F (36.5 C), temperature source Oral, resp. rate 18, height 5\' 9"  (1.753 m), weight 74.8 kg (165 lb), SpO2 98 %.Body mass index is 24.37 kg/m.  General Appearance: Casual  Eye Contact:  Good  Speech:  Normal Rate  Volume:  Normal  Mood:  Euthymic  Affect:  Congruent  Thought Process:  Coherent and Descriptions of Associations: Intact  Orientation:  Full (Time, Place, and Person)  Thought Content:  WDL and Logical  Suicidal Thoughts:  No  Homicidal Thoughts:  No  Memory:  Immediate;   Good Recent;   Good Remote;   Good  Judgement:  Fair  Insight:  Fair  Psychomotor Activity:  Normal   Concentration:  Concentration: Good and Attention Span: Good  Recall:  Good  Fund of Knowledge:  Fair  Language:  Good  Akathisia:  No  Handed:  Right  AIMS (if indicated):     Assets:  Leisure Time Physical Health Resilience Social Support  ADL's:  Intact  Cognition:  WNL  Sleep:      Mental Status Per Nursing Assessment::   On Admission:   cocaine abuse with suicidal ideations  Demographic Factors:  Male, Caucasian and Living alone  Loss Factors: NA  Historical Factors: NA  Risk Reduction Factors:   Sense of responsibility to family  Continued Clinical Symptoms:  None  Cognitive Features That Contribute To Risk:  None    Suicide Risk:  Minimal: No identifiable suicidal ideation.  Patients presenting with no risk factors but with morbid ruminations; may be classified as minimal risk based on the severity of the depressive symptoms    Plan Of Care/Follow-up recommendations:  Activity:  as tolerated Diet:  heart healthy diet  Tommi Crepeau, NP 05/14/2016, 10:37 AM

## 2016-05-14 NOTE — BH Assessment (Signed)
[place] Assessment Progress Note  Per Corena Pilgrim, MD, this pt does not require psychiatric hospitalization at this time.  Pt is to be discharged from [facility] with recommendation to [facility] up with [facility] or the [place].  This has been included in pt's discharge instructions.  Pt's nurse, Narda Rutherford, has been notified.  Jalene Mullet, [place] Triage Specialist [phone]

## 2016-05-14 NOTE — Consult Note (Signed)
[facility] Consult   Reason for Consult:  Cocaine abuse with suicidal ideations Referring Physician:  EDP Patient Identification: Dustin Esparza MRN:  [id number] Principal Diagnosis: Cocaine abuse with cocaine-induced mood disorder [facility]) Diagnosis:   Patient Active Problem List   Diagnosis Date Noted  . Cocaine abuse with cocaine-induced mood disorder ([place]) [F14.14] 05/14/2016    Priority: High  . Cocaine use disorder, severe, dependence ([place]) [F14.20] 03/31/2016  . Alcohol use disorder, moderate, dependence ([place]) [F10.20] 03/31/2016    Total Time spent with patient: 45 minutes  Subjective:   Dustin Esparza is a 56 y.o. male patient has stabilized.  HPI:  56 yo male who presented to the ED after abusing cocaine and having suicidal ideations.  Whenever he does not have cocaine, he reports he gets depressed.  Uses about $100/day.  Today, he is clear and coherent.  No suicidal/homicidal ideations, hallucinations, and withdrawal symptoms.  Inpatient at [facility] in January but did not follow up with his outpatient appointments. Stable for discharge.  Past Psychiatric History: depression, cocaine abuse  Risk to Self: NOne  Risk to Others: Homicidal Ideation: No Thoughts of Harm to Others: No Current Homicidal Intent: No Current Homicidal Plan: No Access to Homicidal Means: No Identified Victim:  (n/a) History of harm to others?: No Assessment of Violence: None Noted Violent Behavior Description:  (patient is calm and cooperative ) Does patient have access to weapons?: No Criminal Charges Pending?: No Does patient have a court date: No Prior Inpatient Therapy: Prior Inpatient Therapy: No Prior Therapy Dates:  (n/a) Prior Therapy Facilty/Provider(s):  (n/a) Reason for Treatment:  (n/a) Prior Outpatient Therapy: Prior Outpatient Therapy: No Prior Therapy Dates:  (n/a) Prior Therapy Facilty/Provider(s):  (n/a) Reason for Treatment:  (n/a) Does patient have an  ACCT team?: No Does patient have Intensive In-House Services?  : No Does patient have [facility] services? : No Does patient have P4CC services?: No  Past Medical History:  Past Medical History:  Diagnosis Date  . Bladder cancer ([place])   . Dysuria-frequency syndrome   . Hematuria   . History of drug use    Quit cocaine Oct 2015  . History of narcotic addiction ([place])   . History of seizure    due to reaction advil/tylenol gel capsule  . HOH (hard of hearing)    right ear  . Recovering alcoholic [facility])    Quit Oct 2015  . Wears glasses     Past Surgical History:  Procedure Laterality Date  . CYSTOSCOPY WITH URETHRAL DILATATION N/A 10/31/2014   Procedure: CYSTOSCOPY WITH URETHRAL DILATATION;  Surgeon: Festus Aloe, MD;  Location: [facility];  Service: Urology;  Laterality: N/A;  . TRANSURETHRAL RESECTION OF BLADDER TUMOR N/A 10/31/2014   Procedure: TRANSURETHRAL RESECTION OF BLADDER TUMOR (TURBT);  Surgeon: Festus Aloe, MD;  Location: [facility];  Service: Urology;  Laterality: N/A;  . TRANSURETHRAL RESECTION OF BLADDER TUMOR N/A 12/05/2014   Procedure: TRANSURETHRAL RESECTION OF BLADDER TUMOR (TURBT);  Surgeon: Festus Aloe, MD;  Location: [facility];  Service: Urology;  Laterality: N/A;   Family History:  Family History  Problem Relation Age of Onset  . Diabetes Mother   . Stroke Mother   . Diabetes Father   . Heart disease Father   . Heart disease Brother   . Heart disease Paternal Grandfather    Family Psychiatric  History: none Social History:  History  Alcohol Use  . Yes    Comment: Daily.  Last drink: 3 hours ago     History  Drug Use  . Types: Cocaine    Comment: Herion, Last used: yesterday    Social History   Social History  . Marital status: Divorced    Spouse name: N/A  . Number of children: N/A  . Years of education: N/A   Social History Main Topics  . Smoking status: Current Every Day Smoker     Packs/day: 1.00    Years: 40.00    Types: Cigarettes  . Smokeless tobacco: Never Used  . Alcohol use Yes     Comment: Daily. Last drink: 3 hours ago  . Drug use: Yes    Types: Cocaine     Comment: Herion, Last used: yesterday  . Sexual activity: Not Asked   Other Topics Concern  . None   Social History Narrative  . None   Additional Social History:    Allergies:   Allergies  Allergen Reactions  . Acetaminophen Anaphylaxis  . Advil Cold & Sinus Liqui-Gels [Pseudoephedrine-Ibuprofen] Anaphylaxis  . Ibuprofen Anaphylaxis, Hives and Swelling         Labs:  Results for orders placed or performed during the hospital encounter of 05/13/16 (from the past 48 hour(s))  Comprehensive metabolic panel     Status: None   Collection Time: 05/13/16  5:29 AM  Result Value Ref Range   Sodium 138 135 - 145 mmol/L   Potassium 3.8 3.5 - 5.1 mmol/L   Chloride 105 101 - 111 mmol/L   CO2 26 22 - 32 mmol/L   Glucose, Bld 82 65 - 99 mg/dL   BUN 7 6 - 20 mg/dL   Creatinine, Ser 0.89 0.61 - 1.24 mg/dL   Calcium 9.2 8.9 - 10.3 mg/dL   Total Protein 7.8 6.5 - 8.1 g/dL   Albumin 4.2 3.5 - 5.0 g/dL   AST 18 15 - 41 U/L   ALT 18 17 - 63 U/L   Alkaline Phosphatase 83 38 - 126 U/L   Total Bilirubin 0.9 0.3 - 1.2 mg/dL   GFR calc non Af Amer >60 >60 mL/min   GFR calc Af Amer >60 >60 mL/min    Comment: (NOTE) The eGFR has been calculated using the CKD EPI equation. This calculation has not been validated in all clinical situations. eGFR's persistently <60 mL/min signify possible Chronic Kidney Disease.    Anion gap 7 5 - 15  Ethanol     Status: None   Collection Time: 05/13/16  5:29 AM  Result Value Ref Range   Alcohol, Ethyl (B) <5 <5 mg/dL    Comment:        LOWEST DETECTABLE LIMIT FOR SERUM ALCOHOL IS 5 mg/dL FOR MEDICAL PURPOSES ONLY   Salicylate level     Status: None   Collection Time: 05/13/16  5:29 AM  Result Value Ref Range   Salicylate Lvl <7.8 2.8 - 30.0 mg/dL   Acetaminophen level     Status: Abnormal   Collection Time: 05/13/16  5:29 AM  Result Value Ref Range   Acetaminophen (Tylenol), Serum <10 (L) 10 - 30 ug/mL    Comment:        THERAPEUTIC CONCENTRATIONS VARY SIGNIFICANTLY. A RANGE OF 10-30 ug/mL MAY BE AN EFFECTIVE CONCENTRATION FOR MANY PATIENTS. HOWEVER, SOME ARE BEST TREATED AT CONCENTRATIONS OUTSIDE THIS RANGE. ACETAMINOPHEN CONCENTRATIONS >150 ug/mL AT 4 HOURS AFTER INGESTION AND >50 ug/mL AT 12 HOURS AFTER INGESTION ARE OFTEN ASSOCIATED WITH TOXIC REACTIONS.   cbc  Status: Abnormal   Collection Time: 05/13/16  5:29 AM  Result Value Ref Range   WBC 15.3 (H) 4.0 - 10.5 K/uL   RBC 4.98 4.22 - 5.81 MIL/uL   Hemoglobin 15.5 13.0 - 17.0 g/dL   HCT 44.4 39.0 - 52.0 %   MCV 89.2 78.0 - 100.0 fL   MCH 31.1 26.0 - 34.0 pg   MCHC 34.9 30.0 - 36.0 g/dL   RDW 15.2 11.5 - 15.5 %   Platelets 393 150 - 400 K/uL  Rapid urine drug screen (hospital performed)     Status: Abnormal   Collection Time: 05/13/16  5:40 AM  Result Value Ref Range   Opiates NONE DETECTED NONE DETECTED   Cocaine POSITIVE (A) NONE DETECTED   Benzodiazepines NONE DETECTED NONE DETECTED   Amphetamines NONE DETECTED NONE DETECTED   Tetrahydrocannabinol NONE DETECTED NONE DETECTED   Barbiturates NONE DETECTED NONE DETECTED    Comment:        DRUG SCREEN FOR MEDICAL PURPOSES ONLY.  IF CONFIRMATION IS NEEDED FOR ANY PURPOSE, NOTIFY LAB WITHIN 5 DAYS.        LOWEST DETECTABLE LIMITS FOR URINE DRUG SCREEN Drug Class       Cutoff (ng/mL) Amphetamine      1000 Barbiturate      200 Benzodiazepine   242 Tricyclics       353 Opiates          300 Cocaine          300 THC              50   .Cooxemetry Panel (carboxy, met, total hgb, O2 sat)     Status: Abnormal   Collection Time: 05/13/16  8:09 AM  Result Value Ref Range   Total hemoglobin 16.7 (H) 12.0 - 16.0 g/dL   O2 Saturation 84.9 %   Carboxyhemoglobin 3.6 (H) 0.5 - 1.5 %   Methemoglobin 0.9 0.0 -  1.5 %    Current Facility-Administered Medications  Medication Dose Route Frequency Provider Last Rate Last Dose  . alum & mag hydroxide-simeth (MAALOX/MYLANTA) 200-200-20 MG/5ML suspension 30 mL  30 mL Oral PRN Monico Blitz, PA-C      . FLUoxetine (PROZAC) capsule 10 mg  10 mg Oral Daily Sinjin Amero, MD   10 mg at 05/14/16 1006  . gabapentin (NEURONTIN) capsule 200 mg  200 mg Oral BID Corena Pilgrim, MD   200 mg at 05/14/16 1006  . hydrOXYzine (ATARAX/VISTARIL) tablet 25 mg  25 mg Oral TID PRN Corena Pilgrim, MD   25 mg at 05/13/16 2106  . nicotine (NICODERM CQ - dosed in mg/24 hours) patch 21 mg  21 mg Transdermal Daily Nicole Pisciotta, PA-C   Stopped at 05/13/16 1400  . ondansetron (ZOFRAN) tablet 4 mg  4 mg Oral Q8H PRN Monico Blitz, PA-C       No current outpatient prescriptions on file.    Musculoskeletal: Strength & Muscle Tone: within normal limits Gait & Station: normal Patient leans: N/A  Psychiatric Specialty Exam: Physical Exam  Constitutional: He is oriented to person, place, and time. He appears well-developed and well-nourished.  HENT:  Head: Normocephalic.  Neck: Normal range of motion.  Respiratory: Effort normal.  Musculoskeletal: Normal range of motion.  Neurological: He is alert and oriented to person, place, and time.  Psychiatric: He has a normal mood and affect. His speech is normal and behavior is normal. Judgment and thought content normal. Cognition and memory are normal.  Review of Systems  Psychiatric/Behavioral: Positive for substance abuse.  All other systems reviewed and are negative.   Blood pressure 103/73, pulse 72, temperature 97.7 F (36.5 C), temperature source Oral, resp. rate 18, height '5\' 9"'  (1.753 m), weight 74.8 kg (165 lb), SpO2 98 %.Body mass index is 24.37 kg/m.  General Appearance: Casual  Eye Contact:  Good  Speech:  Normal Rate  Volume:  Normal  Mood:  Euthymic  Affect:  Congruent  Thought Process:  Coherent  and Descriptions of Associations: Intact  Orientation:  Full (Time, Place, and Person)  Thought Content:  WDL and Logical  Suicidal Thoughts:  No  Homicidal Thoughts:  No  Memory:  Immediate;   Good Recent;   Good Remote;   Good  Judgement:  Fair  Insight:  Fair  Psychomotor Activity:  Normal  Concentration:  Concentration: Good and Attention Span: Good  Recall:  Good  Fund of Knowledge:  Fair  Language:  Good  Akathisia:  No  Handed:  Right  AIMS (if indicated):     Assets:  Leisure Time Physical Health Resilience Social Support  ADL's:  Intact  Cognition:  WNL  Sleep:        Treatment Plan Summary: Daily contact with patient to assess and evaluate symptoms and progress in treatment, Medication management and Plan cocaine abuse with cocaine induced mood disorder:  -Crisis stabilization -Medication management:  Started Prozac 10 mg daily for depression, Vistaril 25 mg TID PRN anxiety, and Gabapentin 200 mg BID for withdrawal symptoms -Individual and substance abuse counseling  Disposition: No evidence of imminent risk to self or others at present.    Waylan Boga, NP 05/14/2016 10:31 AM  Patient seen face-to-face for psychiatric evaluation, chart reviewed and case discussed with the physician extender and developed treatment plan. Reviewed the information documented and agree with the treatment plan. Corena Pilgrim, MD

## 2016-05-14 NOTE — ED Notes (Addendum)
Written dc instructions and prescriptions reviewed with pt.  Pt encouraged to take medications as directed and follow up with the OP resources provided.  Pt encouraged to seek treatment for return of suicidal thoughts.  Pt verbalized understanding.

## 2016-05-14 NOTE — ED Notes (Signed)
Pt ambulatory w/o difficulty to dc area with mHt.  Belongings returned after leaving the area.

## 2016-05-14 NOTE — ED Notes (Signed)
Up to the bathroom 

## 2017-09-26 ENCOUNTER — Emergency Department (HOSPITAL_BASED_OUTPATIENT_CLINIC_OR_DEPARTMENT_OTHER)
Admission: EM | Admit: 2017-09-26 | Discharge: 2017-09-26 | Disposition: A | Discharge status: Home / Self Care | Payer: Self-pay | Attending: Emergency Medicine | Admitting: Emergency Medicine

## 2017-09-26 ENCOUNTER — Other Ambulatory Visit: Payer: Self-pay

## 2017-09-26 ENCOUNTER — Encounter (HOSPITAL_BASED_OUTPATIENT_CLINIC_OR_DEPARTMENT_OTHER): Payer: Self-pay | Admitting: *Deleted

## 2017-09-26 DIAGNOSIS — Z8551 Personal history of malignant neoplasm of bladder: Secondary | ICD-10-CM | POA: Insufficient documentation

## 2017-09-26 DIAGNOSIS — Y939 Activity, unspecified: Secondary | ICD-10-CM | POA: Insufficient documentation

## 2017-09-26 DIAGNOSIS — Y999 Unspecified external cause status: Secondary | ICD-10-CM | POA: Insufficient documentation

## 2017-09-26 DIAGNOSIS — Z79899 Other long term (current) drug therapy: Secondary | ICD-10-CM | POA: Insufficient documentation

## 2017-09-26 DIAGNOSIS — Y929 Unspecified place or not applicable: Secondary | ICD-10-CM | POA: Insufficient documentation

## 2017-09-26 DIAGNOSIS — S0181XA Laceration without foreign body of other part of head, initial encounter: Secondary | ICD-10-CM | POA: Insufficient documentation

## 2017-09-26 DIAGNOSIS — W0110XA Fall on same level from slipping, tripping and stumbling with subsequent striking against unspecified object, initial encounter: Secondary | ICD-10-CM | POA: Insufficient documentation

## 2017-09-26 DIAGNOSIS — F17210 Nicotine dependence, cigarettes, uncomplicated: Secondary | ICD-10-CM | POA: Insufficient documentation

## 2017-09-26 MED ORDER — LIDOCAINE HCL (PF) 1 % IJ SOLN
INTRAMUSCULAR | Status: AC
  Administered 2017-09-26: 5 mL via INTRADERMAL
  Filled 2017-09-26: qty 5

## 2017-09-26 MED ORDER — LIDOCAINE HCL (PF) 1 % IJ SOLN
5.0000 mL | Freq: Once | INTRAMUSCULAR | Status: AC
  Administered 2017-09-26: 5 mL via INTRADERMAL

## 2017-09-26 NOTE — ED Notes (Signed)
Bacitracin and dressing applied to chin

## 2017-09-26 NOTE — Discharge Instructions (Addendum)
Local wound care with bacitracin and dressing changes twice daily.  Stitches are to be removed in 1 week.  Please follow-up with your primary doctor for this.  Return to the emergency department for increased pain, pus draining from the wound, redness, or other new and concerning symptoms.

## 2017-09-26 NOTE — ED Triage Notes (Signed)
Pt fell while carrying a couch upstairs and hit chin on concrete steps. Lac noted to underside of chin

## 2017-09-26 NOTE — ED Provider Notes (Signed)
[facility] EMERGENCY DEPARTMENT Provider Note   CSN: [id number] Arrival date & time: 09/26/17  2159     History   Chief Complaint Chief Complaint  Patient presents with  . Laceration    HPI Dustin Esparza is a 57 y.o. male.  Patient is a 57 year old male presenting with complaints of a chin laceration.  He was assisting a friend moving a couch when his friend lost his balance and they both fell.  He states he hit his chin on a concrete step.  He denies any loss of consciousness.  He denies any dental injury.  He denies any malocclusion of his teeth.  Last tetanus shot was 2 years ago.  The history is provided by the patient.  Laceration   The incident occurred less than 1 hour ago. The laceration is located on the face. Size: 2.5. The pain is moderate. The pain has been constant since onset. He reports no foreign bodies present. His tetanus status is UTD.    Past Medical History:  Diagnosis Date  . Bladder cancer ([place])   . Dysuria-frequency syndrome   . Hematuria   . History of drug use    Quit cocaine Oct 2015  . History of narcotic addiction ([place])   . History of seizure    due to reaction advil/tylenol gel capsule  . HOH (hard of hearing)    right ear  . Recovering alcoholic [facility])    Quit Oct 2015  . Wears glasses     Patient Active Problem List   Diagnosis Date Noted  . Cocaine abuse with cocaine-induced mood disorder ([place]) 05/14/2016  . Cocaine use disorder, severe, dependence ([place]) 03/31/2016  . Alcohol use disorder, moderate, dependence ([place]) 03/31/2016    Past Surgical History:  Procedure Laterality Date  . CYSTOSCOPY WITH URETHRAL DILATATION N/A 10/31/2014   Procedure: CYSTOSCOPY WITH URETHRAL DILATATION;  Surgeon: Festus Aloe, MD;  Location[facility];  Service: Urology;  Laterality: N/A;  . TRANSURETHRAL RESECTION OF BLADDER TUMOR N/A 10/31/2014   Procedure: TRANSURETHRAL RESECTION OF BLADDER TUMOR (TURBT);  Surgeon:  Festus Aloe, MD;  Location: [facility];  Service: Urology;  Laterality: N/A;  . TRANSURETHRAL RESECTION OF BLADDER TUMOR N/A 12/05/2014   Procedure: TRANSURETHRAL RESECTION OF BLADDER TUMOR (TURBT);  Surgeon: Festus Aloe, MD;  Location: [facility];  Service: Urology;  Laterality: N/A;        Home Medications    Prior to Admission medications   Medication Sig Start Date End Date Taking? Authorizing Provider  FLUoxetine (PROZAC) 10 MG capsule Take 1 capsule (10 mg total) by mouth daily. 05/15/16   Patrecia Pour, NP  gabapentin (NEURONTIN) 100 MG capsule Take 2 capsules (200 mg total) by mouth 2 (two) times daily. 05/14/16   Patrecia Pour, NP  hydrOXYzine (ATARAX/VISTARIL) 25 MG tablet Take 1 tablet (25 mg total) by mouth 3 (three) times daily as needed for anxiety. 05/14/16   Patrecia Pour, NP    Family History Family History  Problem Relation Age of Onset  . Diabetes Mother   . Stroke Mother   . Diabetes Father   . Heart disease Father   . Heart disease Brother   . Heart disease Paternal Grandfather     Social History Social History   Tobacco Use  . Smoking status: Current Every Day Smoker    Packs/day: 1.00    Years: 40.00    Pack years: 40.00    Types: Cigarettes  .  Smokeless tobacco: Never Used  Substance Use Topics  . Alcohol use: Yes  . Drug use: Not Currently    Types: Cocaine    Comment: Herion, Last used: yesterday     Allergies   Acetaminophen; Advil cold & sinus liqui-gels [pseudoephedrine-ibuprofen]; and Ibuprofen   Review of Systems Review of Systems  All other systems reviewed and are negative.    Physical Exam Updated Vital Signs BP 111/79 (BP Location: Left Arm)   Pulse 82   Temp 98.5 F (36.9 C) (Oral)   Resp 18   Ht 5\' 9"  (1.753 m)   Wt 83.9 kg (185 lb)   BMI 27.32 kg/m   Physical Exam  Constitutional: He is oriented to person, place, and time. He appears well-developed and well-nourished. No  distress.  HENT:  Head: Normocephalic and atraumatic.  There is a 2.5 cm laceration to the underside of the chin..  There is no dental injury or malocclusion of the teeth.  Neck: Normal range of motion. Neck supple.  Pulmonary/Chest: Effort normal.  Neurological: He is alert and oriented to person, place, and time.  Skin: Skin is warm and dry. He is not diaphoretic.  Nursing note and vitals reviewed.    ED Treatments / Results  Labs (all labs ordered are listed, but only abnormal results are displayed) Labs Reviewed - No data to display  EKG None  Radiology No results found.  Procedures Procedures (including critical care time)  Medications Ordered in ED Medications  lidocaine (PF) (XYLOCAINE) 1 % injection (has no administration in time range)     Initial Impression / Assessment and Plan / ED Course  I have reviewed the triage vital signs and the nursing notes.  Pertinent labs & imaging results that were available during my care of the patient were reviewed by me and considered in my medical decision making (see chart for details).  LACERATION REPAIR Performed by: Veryl Speak Authorized by: Veryl Speak Consent: Verbal consent obtained. Risks and benefits: risks, benefits and alternatives were discussed Consent given by: patient Patient identity confirmed: provided demographic data Prepped and Draped in normal sterile fashion Wound explored  Laceration Location: chin  Laceration Length: 2.5cm  No Foreign Bodies seen or palpated  Anesthesia: local infiltration  Local anesthetic: lidocaine 1% without epinephrine  Anesthetic total: 3 ml  Irrigation method: syringe Amount of cleaning: standard  Skin closure: 4-0 ethilon  Number of sutures: 3  Technique: simple interrupted  Patient tolerance: Patient tolerated the procedure well with no immediate complications.   Final Clinical Impressions(s) / ED Diagnoses   Final diagnoses:  None    ED  Discharge Orders    None       Veryl Speak, MD 09/26/17 2325

## 2018-05-27 ENCOUNTER — Emergency Department (HOSPITAL_BASED_OUTPATIENT_CLINIC_OR_DEPARTMENT_OTHER)
Admission: EM | Admit: 2018-05-27 | Discharge: 2018-05-27 | Disposition: A | Discharge status: Home / Self Care | Payer: Self-pay | Attending: Emergency Medicine | Admitting: Emergency Medicine

## 2018-05-27 ENCOUNTER — Other Ambulatory Visit: Payer: Self-pay

## 2018-05-27 ENCOUNTER — Emergency Department (HOSPITAL_BASED_OUTPATIENT_CLINIC_OR_DEPARTMENT_OTHER): Payer: Self-pay

## 2018-05-27 ENCOUNTER — Encounter (HOSPITAL_BASED_OUTPATIENT_CLINIC_OR_DEPARTMENT_OTHER): Payer: Self-pay | Admitting: Emergency Medicine

## 2018-05-27 DIAGNOSIS — Y939 Activity, unspecified: Secondary | ICD-10-CM | POA: Insufficient documentation

## 2018-05-27 DIAGNOSIS — Y999 Unspecified external cause status: Secondary | ICD-10-CM | POA: Insufficient documentation

## 2018-05-27 DIAGNOSIS — S52501A Unspecified fracture of the lower end of right radius, initial encounter for closed fracture: Secondary | ICD-10-CM | POA: Insufficient documentation

## 2018-05-27 DIAGNOSIS — W010XXA Fall on same level from slipping, tripping and stumbling without subsequent striking against object, initial encounter: Secondary | ICD-10-CM | POA: Insufficient documentation

## 2018-05-27 DIAGNOSIS — F17210 Nicotine dependence, cigarettes, uncomplicated: Secondary | ICD-10-CM | POA: Insufficient documentation

## 2018-05-27 DIAGNOSIS — Y929 Unspecified place or not applicable: Secondary | ICD-10-CM | POA: Insufficient documentation

## 2018-05-27 DIAGNOSIS — S52614A Nondisplaced fracture of right ulna styloid process, initial encounter for closed fracture: Secondary | ICD-10-CM | POA: Insufficient documentation

## 2018-05-27 DIAGNOSIS — Z8551 Personal history of malignant neoplasm of bladder: Secondary | ICD-10-CM | POA: Insufficient documentation

## 2018-05-27 MED ORDER — HALOPERIDOL LACTATE 5 MG/ML IJ SOLN
2.0000 mg | Freq: Once | INTRAMUSCULAR | Status: AC
  Administered 2018-05-27: 2 mg via INTRAMUSCULAR
  Filled 2018-05-27: qty 1

## 2018-05-27 MED ORDER — TRAMADOL HCL 50 MG PO TABS: 50.0000 mg | ORAL_TABLET | Freq: Four times a day (QID) | ORAL | 0 refills | Status: AC | PRN

## 2018-05-27 MED ORDER — LIDOCAINE 5 % EX PTCH: 1.0000 | MEDICATED_PATCH | CUTANEOUS | 0 refills | Status: AC

## 2018-05-27 NOTE — ED Triage Notes (Signed)
Pt tripped backwards and fell injury right wrist

## 2018-05-27 NOTE — ED Provider Notes (Signed)
[facility] EMERGENCY DEPARTMENT Provider Note   CSN: [id number] Arrival date & time: 05/27/18  0255    History   Chief Complaint Chief Complaint  Patient presents with  . Wrist Injury    HPI Dustin Esparza is a 58 y.o. male.     The history is provided by the patient.  Wrist Injury  Location:  Wrist Wrist location:  R wrist Injury: yes   Time since incident:  2 hours Mechanism of injury: fall   Fall:    Fall occurred:  Standing   Height of fall:  4 feet   Impact surface:  Concrete   Point of impact:  Hands   Entrapped after fall: no   Pain details:    Quality:  Aching   Radiates to:  Does not radiate   Severity:  Moderate   Onset quality:  Sudden   Duration:  2 hours   Timing:  Constant   Progression:  Unchanged Handedness:  Right-handed Foreign body present:  No foreign bodies Prior injury to area:  No Relieved by:  Nothing Worsened by:  Nothing Ineffective treatments:  None tried Associated symptoms: no back pain, no decreased range of motion, no fatigue, no fever, no muscle weakness, no neck pain, no numbness, no stiffness, no swelling and no tingling   Risk factors: no concern for non-accidental trauma     Past Medical History:  Diagnosis Date  . Bladder cancer ([place])   . Dysuria-frequency syndrome   . Hematuria   . History of drug use    Quit cocaine Oct 2015  . History of narcotic addiction ([place])   . History of seizure    due to reaction advil/tylenol gel capsule  . HOH (hard of hearing)    right ear  . Recovering alcoholic [facility])    Quit Oct 2015  . Wears glasses     Patient Active Problem List   Diagnosis Date Noted  . Cocaine abuse with cocaine-induced mood disorder ([place]) 05/14/2016  . Cocaine use disorder, severe, dependence ([place]) 03/31/2016  . Alcohol use disorder, moderate, dependence ([place]) 03/31/2016    Past Surgical History:  Procedure Laterality Date  . CYSTOSCOPY WITH URETHRAL DILATATION N/A 10/31/2014   Procedure: CYSTOSCOPY WITH URETHRAL DILATATION;  Surgeon: Festus Aloe, MD;  Location[facility];  Service: Urology;  Laterality: N/A;  . TRANSURETHRAL RESECTION OF BLADDER TUMOR N/A 10/31/2014   Procedure: TRANSURETHRAL RESECTION OF BLADDER TUMOR (TURBT);  Surgeon: Festus Aloe, MD;  Location: [facility];  Service: Urology;  Laterality: N/A;  . TRANSURETHRAL RESECTION OF BLADDER TUMOR N/A 12/05/2014   Procedure: TRANSURETHRAL RESECTION OF BLADDER TUMOR (TURBT);  Surgeon: Festus Aloe, MD;  Location[facility]  Service: Urology;  Laterality: N/A;        Home Medications    Prior to Admission medications   Medication Sig Start Date End Date Taking? Authorizing Provider  lidocaine (LIDODERM) 5 % Place 1 patch onto the skin daily. Remove & Discard patch within 12 hours or as directed by MD 05/27/18   Randal Buba, Abdelaziz Westenberger, MD  traMADol (ULTRAM) 50 MG tablet Take 1 tablet (50 mg total) by mouth every 6 (six) hours as needed. 05/27/18   Crisol Muecke, MD    Family History Family History  Problem Relation Age of Onset  . Diabetes Mother   . Stroke Mother   . Diabetes Father   . Heart disease Father   . Heart disease Brother   . Heart disease  Paternal Grandfather     Social History Social History   Tobacco Use  . Smoking status: Current Every Day Smoker    Packs/day: 1.00    Years: 40.00    Pack years: 40.00    Types: Cigarettes  . Smokeless tobacco: Never Used  Substance Use Topics  . Alcohol use: Yes  . Drug use: Not Currently    Types: Cocaine    Comment: [place], Last used: yesterday     Allergies   Acetaminophen; Advil cold & sinus liqui-gels [pseudoephedrine-ibuprofen]; and Ibuprofen   Review of Systems Review of Systems  Constitutional: Negative for fatigue and fever.  Respiratory: Negative for shortness of breath.   Cardiovascular: Negative for chest pain.  Musculoskeletal: Positive for arthralgias and joint  swelling. Negative for back pain, neck pain and stiffness.  All other systems reviewed and are negative.    Physical Exam Updated Vital Signs BP 113/83 (BP Location: Left Arm)   Pulse 76   Temp 97.8 F (36.6 C) (Oral)   Resp 18   Ht 5\' 9"  (1.753 m)   Wt 77.1 kg   SpO2 98%   BMI 25.10 kg/m   Physical Exam Vitals signs and nursing note reviewed.  Constitutional:      General: He is not in acute distress.    Appearance: He is normal weight.  HENT:     Head: Normocephalic and atraumatic.     Nose: Nose normal.     Mouth/Throat:     Mouth: Mucous membranes are moist.     Pharynx: Oropharynx is clear.  Eyes:     Conjunctiva/sclera: Conjunctivae normal.     Pupils: Pupils are equal, round, and reactive to light.  Neck:     Musculoskeletal: Normal range of motion and neck supple.  Cardiovascular:     Rate and Rhythm: Normal rate and regular rhythm.     Pulses: Normal pulses.     Heart sounds: Normal heart sounds.  Pulmonary:     Effort: Pulmonary effort is normal.     Breath sounds: Normal breath sounds.  Abdominal:     General: Abdomen is flat. Bowel sounds are normal.     Tenderness: There is no abdominal tenderness.  Musculoskeletal:     Right wrist: He exhibits tenderness, bony tenderness, swelling and deformity. He exhibits no effusion.     Right upper arm: Normal.     Right forearm: Normal.     Right hand: Normal. He exhibits normal capillary refill. Normal sensation noted. Normal strength noted.  Skin:    General: Skin is warm and dry.     Capillary Refill: Capillary refill takes less than 2 seconds.  Neurological:     General: No focal deficit present.     Mental Status: He is alert and oriented to person, place, and time. Mental status is at baseline.  Psychiatric:        Mood and Affect: Mood normal.        Behavior: Behavior normal.      ED Treatments / Results  Labs (all labs ordered are listed, but only abnormal results are displayed) Labs  Reviewed - No data to display  EKG None  Radiology Dg Wrist Complete Right  Result Date: 05/27/2018 CLINICAL DATA:  [place] 2 hours ago landing on the right wrist. Pain and swelling. EXAM: RIGHT WRIST - COMPLETE 3+ VIEW COMPARISON:  None. FINDINGS: Transverse, mildly comminuted fracture of the distal radial metaphysis. A fracture component extends to the dorsal ulnar articular surface.  Dorsal impaction leads to dorsal angulation of the distal radial articular surface of approximately 16 degrees. No significant fracture displacement. There is an associated ulnar styloid fracture. Joints are normally spaced and aligned. There is surrounding soft tissue swelling. IMPRESSION: 1. Mildly comminuted, dorsally impacted fracture of the distal radial metaphysis with 16 degrees of dorsal angulation of the distal radial articular surface. No fracture displacement. 2. Associated ulnar styloid fracture. 3. No dislocation. Electronically Signed   By: Lajean Manes M.D.   On: 05/27/2018 03:27    Procedures Procedures (including critical care time)  Medications Ordered in ED Medications  haloperidol lactate (HALDOL) injection 2 mg (2 mg Intramuscular Given 05/27/18 0417)   Patient is anaphylactic to both tylenol and ibuprofen per his report and could not find a ride home and did not want to wait in the ED if given narcotic pain medication.  He said he would prefer just getting an RX.  I gave a dose of haldol and assisted in splinting the patient.  Alignment markedly improved.     [place] reviewed.  No outstanding scripts.  I would have liked to conserve narcotics completely given patient's history but cannot give NSAIDs with history of anaphylaxis, so I was unable to do so.  I gave a very few narcotics.   I have told the patient he will need to follow up with orthopedics for definitive care and additional pain medication if necessary.    Splinted.  Ice and follow up instructions given.  Follow up with Hand  surgery.  Patient verbalizes instructions Final Clinical Impressions(s) / ED Diagnoses   Final diagnoses:  Closed fracture of distal end of right radius, unspecified fracture morphology, initial encounter  Closed nondisplaced fracture of styloid process of right ulna, initial encounter   Return for intractable cough, coughing up blood,fevers >100.4 unrelieved by medication, shortness of breath, intractable vomiting, chest pain, shortness of breath, weakness,numbness, changes in speech, facial asymmetry,abdominal pain, passing out,Inability to tolerate liquids or food, cough, altered mental status or any concerns. No signs of systemic illness or infection. The patient is nontoxic-appearing on exam and vital signs are within normal limits.   I have reviewed the triage vital signs and the nursing notes. Pertinent labs &imaging results that were available during my care of the patient were reviewed by me and considered in my medical decision making (see chart for details).  After history, exam, and medical workup I feel the patient has been appropriately medically screened and is safe for discharge home. Pertinent diagnoses were discussed with the patient. Patient was given return precautions.   ED Discharge Orders         Ordered    traMADol (ULTRAM) 50 MG tablet  Every 6 hours PRN     05/27/18 0436    lidocaine (LIDODERM) 5 %  Every 24 hours     05/27/18 0436           Justyne Roell, MD 05/27/18 [id number]

## 2018-05-27 NOTE — ED Notes (Signed)
PT states understanding of care given, follow up care, and medication prescribed. PT ambulated from ED to car with a steady gait. 

## 2018-05-27 NOTE — ED Notes (Signed)
Pt reports having ice on injury PTA

## 2019-09-19 IMAGING — DX RIGHT WRIST - COMPLETE 3+ VIEW
5 series · 5 of 5 positions shown · non-contrast
Comparison: None.

CLINICAL DATA: Fell 2 hours ago landing on the right wrist. Pain
and swelling.

EXAM:
RIGHT WRIST - COMPLETE 3+ VIEW

[wrist pa]
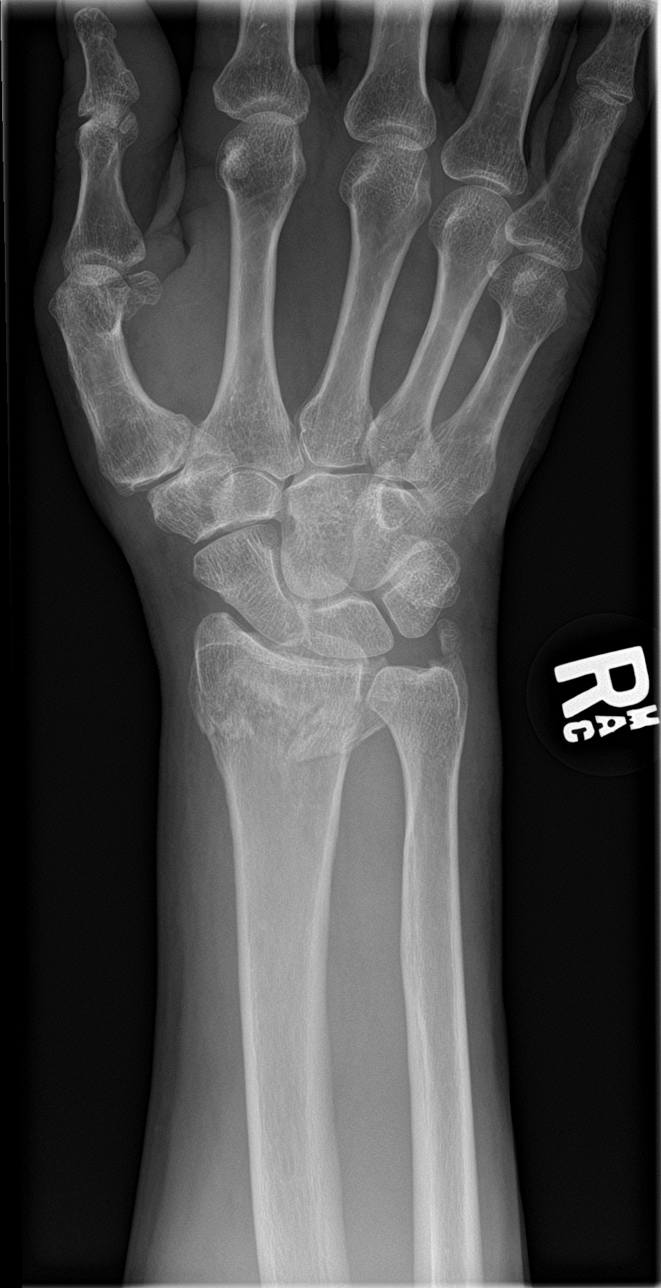

[wrist obl]
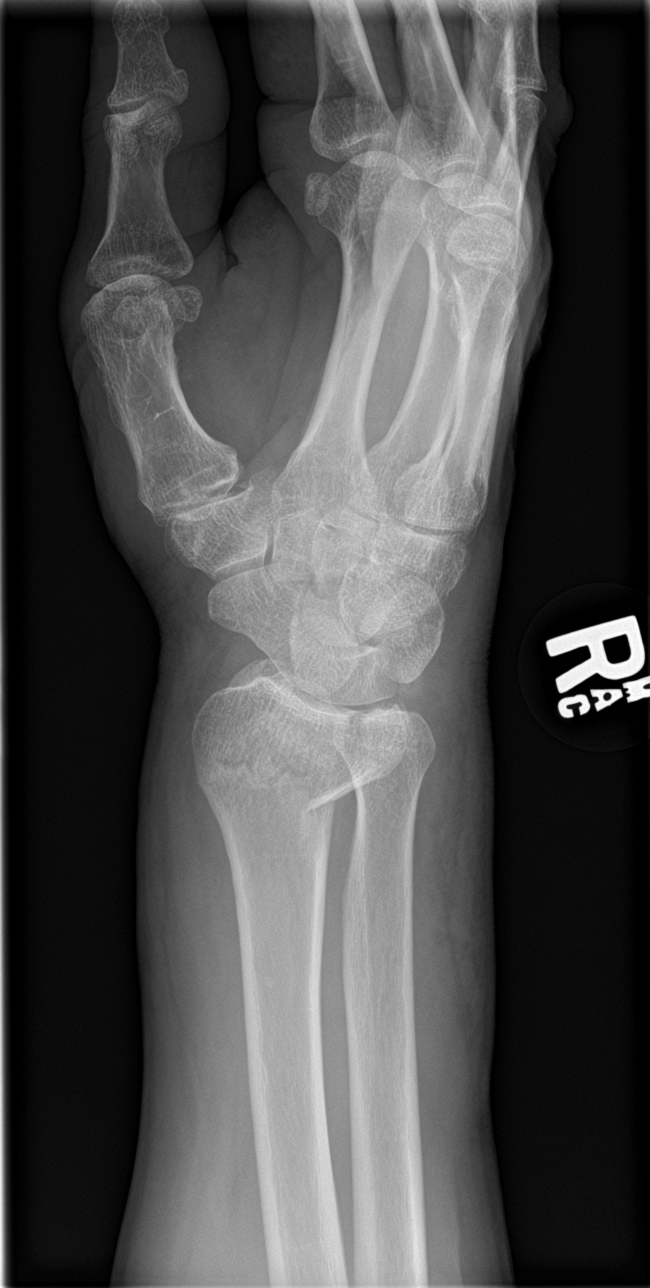

[wrist lat (1 of 2)]
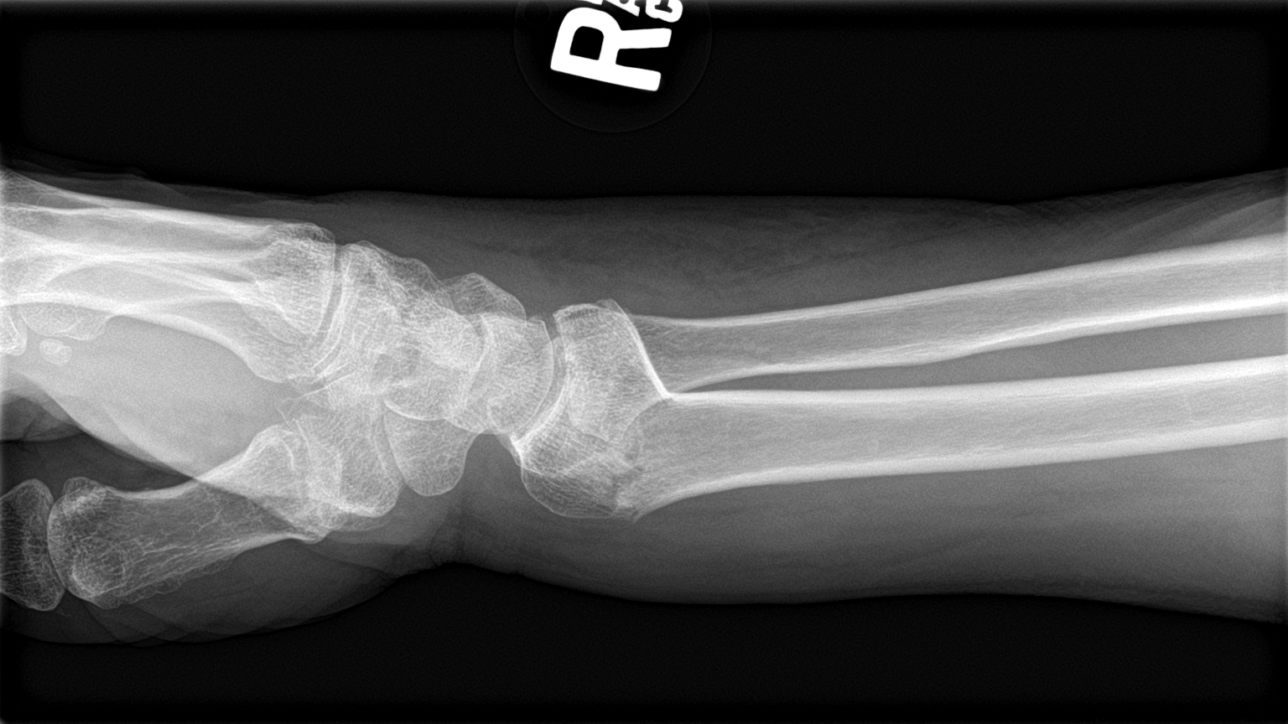

[wrist navicular]
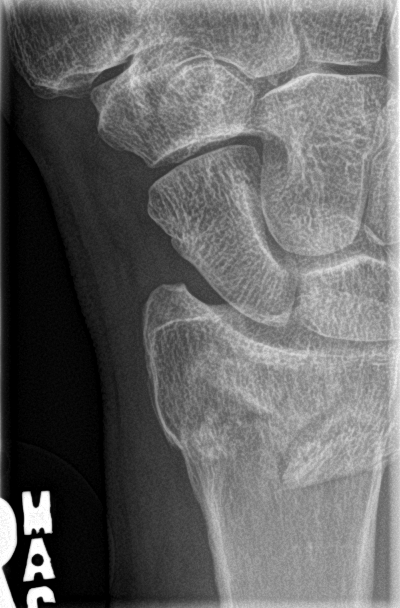

[wrist lat (2 of 2)]
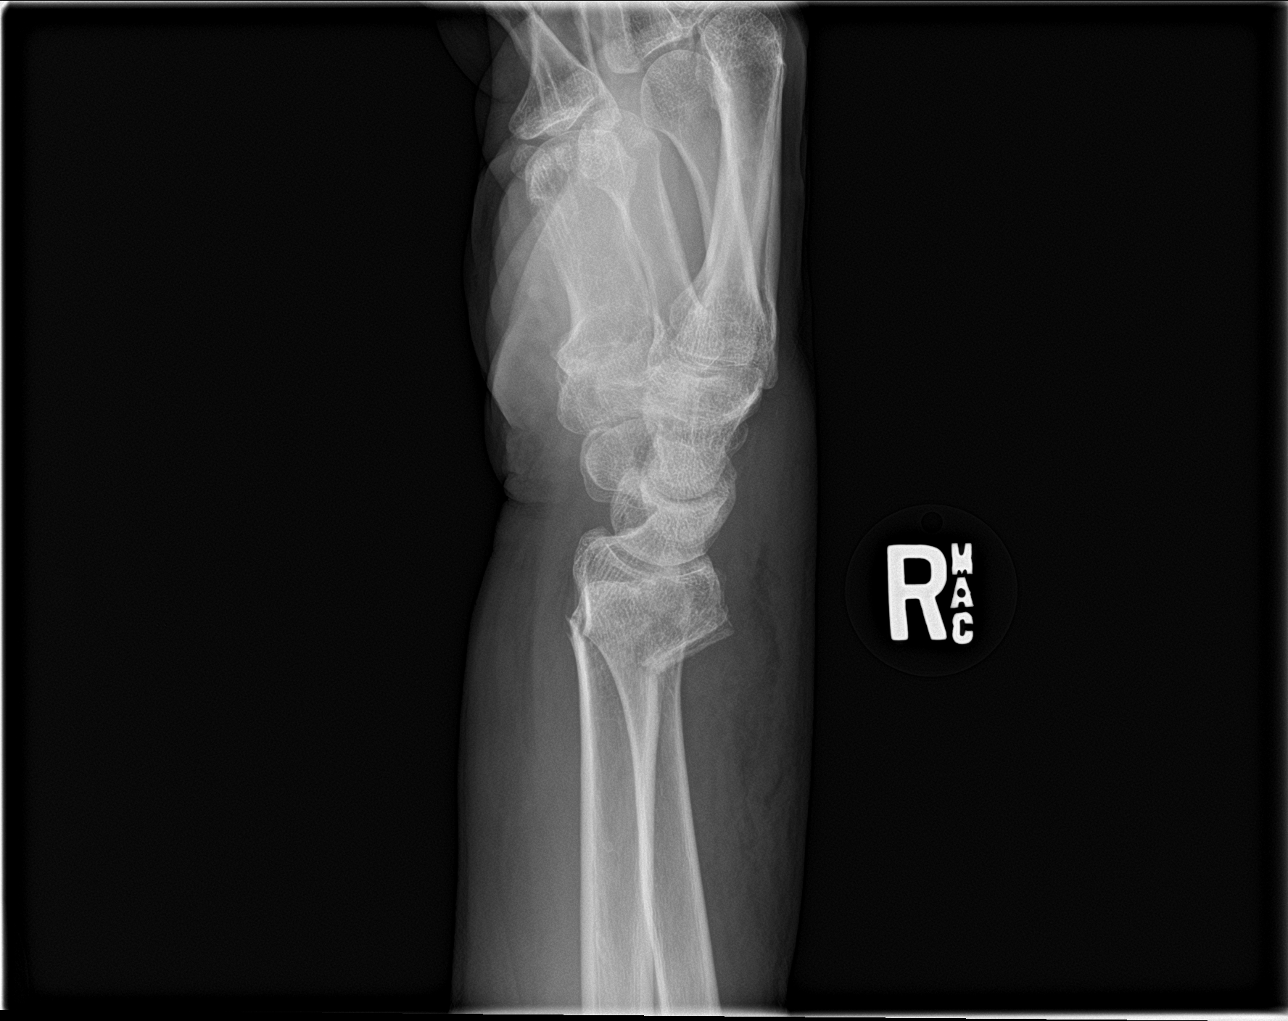

[5 of 5 positions shown; findings below may reference images not displayed]

FINDINGS: Transverse, mildly comminuted fracture of the distal radial
metaphysis. A fracture component extends to the dorsal ulnar
articular surface. Dorsal impaction leads to dorsal angulation of
the distal radial articular surface of approximately 16 degrees. No
significant fracture displacement.

There is an associated ulnar styloid fracture.

Joints are normally spaced and aligned.

There is surrounding soft tissue swelling.
IMPRESSION: 1. Mildly comminuted, dorsally impacted fracture of the distal
radial metaphysis with 16 degrees of dorsal angulation of the distal
radial articular surface. No fracture displacement.
2. Associated ulnar styloid fracture.
3. No dislocation.
